# Patient Record
Sex: Female | Born: 1995
Health system: Southern US, Community
[De-identification: ages and names within clinical notes are randomized; demographics above are authoritative.]

## PROBLEM LIST (undated history)

## (undated) DIAGNOSIS — Z01419 Encounter for gynecological examination (general) (routine) without abnormal findings: Secondary | ICD-10-CM

## (undated) DIAGNOSIS — L309 Dermatitis, unspecified: Secondary | ICD-10-CM

## (undated) DIAGNOSIS — T7840XA Allergy, unspecified, initial encounter: Secondary | ICD-10-CM

## (undated) DIAGNOSIS — Z309 Encounter for contraceptive management, unspecified: Secondary | ICD-10-CM

## (undated) DIAGNOSIS — F909 Attention-deficit hyperactivity disorder, unspecified type: Secondary | ICD-10-CM

## (undated) HISTORY — DX: Allergy, unspecified, initial encounter: T78.40XA

## (undated) HISTORY — DX: Encounter for gynecological examination (general) (routine) without abnormal findings: Z01.419

## (undated) HISTORY — DX: Dermatitis, unspecified: L30.9

## (undated) HISTORY — DX: Attention-deficit hyperactivity disorder, unspecified type: F90.9

## (undated) HISTORY — DX: Encounter for contraceptive management, unspecified: Z30.9

## (undated) HISTORY — PX: GUM SURGERY: SHX658

---
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
## 2013-12-20 ENCOUNTER — Encounter: Payer: Self-pay | Admitting: Medical

## 2013-12-20 ENCOUNTER — Ambulatory Visit (INDEPENDENT_AMBULATORY_CARE_PROVIDER_SITE_OTHER): Payer: Managed Care, Other (non HMO) | Admitting: Medical

## 2013-12-20 VITALS — BP 100/60 | HR 84 | Temp 98.3°F | Resp 16 | Wt 135.0 lb

## 2013-12-20 DIAGNOSIS — J069 Acute upper respiratory infection, unspecified: Secondary | ICD-10-CM

## 2013-12-20 NOTE — Progress Notes (Signed)
Subjective:  Kelly Dean is a 18 y.o. female who presents for illness.  Symptoms include 1+wk hx/o burning nose inside, sore throat, head congestion, has had some purulent nasal discharge.  No frank sinus or teeth pain.  Has a lot of cough.  No chest congestion, but cough is productive.   [facility] and was told she had the flu, but no flu test was done, was prescribed fluticasone nasal and cough suppressant.  Using mucinex.  Was using some sudafed.  No tamiflu was prescribed.   Denies fever, NVD, rash, no body aches or chills.  Patient is a non-smoker.  +several sick contacts with colds, no flu exposures.  No other aggravating or relieving factors.  No other c/o.  ROS as in subjective    Objective: Filed Vitals:   12/20/13 1021  BP: 100/60  Pulse: 84  Temp: 98.3 F (36.8 C)  Resp: 16    General appearance: Alert, WD/WN, no distress                             Skin: warm, no rash                           Head: no sinus tenderness,                            Eyes: conjunctiva normal, corneas clear, PERRLA                            Ears: pearly TMs, external ear canals normal                          Nose: septum midline, turbinates swollen, with mild erythema and clear discharge             Mouth/throat: MMM, tongue normal, mild pharyngeal erythema                           Neck: supple, no adenopathy, no thyromegaly, nontender                          Heart: RRR, normal S1, S2, no murmurs                         Lungs: CTA bilaterally, no wheezes, rales, or rhonchi      Assessment and Plan:   Encounter Diagnosis  Name Primary?  Marland Kitchen. Upper respiratory infection Yes   Current symptoms and exam suggest URI.  This is day 8.  Advised the following:  Patient Instructions  Upper respiratory infection   Continue OTC mucinex or sudafed  Hydrate well with water  Use the flonase spray, 1-2 sprays twice daily (sniff sniff)  You can flush the sinuses with salt  water/neti pot  Salt water gargles  Make sure you are hydrating well with water  And if desired, can use Afrin at bedtime ONLY for the next 3-4 days  If no improvement or worse sinus pressure, teeth pain, fever, etc. In the next few days, then call back for antibiotic

## 2013-12-20 NOTE — Patient Instructions (Signed)
Upper respiratory infection   Continue OTC mucinex or sudafed  Hydrate well with water  Use the flonase spray, 1-2 sprays twice daily (sniff sniff)  You can flush the sinuses with salt water/neti pot  Salt water gargles  Make sure you are hydrating well with water  And if desired, can use Afrin at bedtime ONLY for the next 3-4 days  If no improvement or worse sinus pressure, teeth pain, fever, etc. In the next few days, then call back for antibiotic

## 2014-05-29 ENCOUNTER — Encounter: Payer: Self-pay | Admitting: Medical

## 2014-05-29 ENCOUNTER — Ambulatory Visit (INDEPENDENT_AMBULATORY_CARE_PROVIDER_SITE_OTHER): Payer: Managed Care, Other (non HMO) | Admitting: Medical

## 2014-05-29 VITALS — BP 98/68 | HR 62 | Temp 98.1°F | Resp 14 | Ht 60.0 in | Wt 130.0 lb

## 2014-05-29 DIAGNOSIS — L309 Dermatitis, unspecified: Secondary | ICD-10-CM

## 2014-05-29 DIAGNOSIS — Z23 Encounter for immunization: Secondary | ICD-10-CM

## 2014-05-29 DIAGNOSIS — Z8349 Family history of other endocrine, nutritional and metabolic diseases: Secondary | ICD-10-CM

## 2014-05-29 DIAGNOSIS — L259 Unspecified contact dermatitis, unspecified cause: Secondary | ICD-10-CM

## 2014-05-29 DIAGNOSIS — Z139 Encounter for screening, unspecified: Secondary | ICD-10-CM

## 2014-05-29 DIAGNOSIS — J309 Allergic rhinitis, unspecified: Secondary | ICD-10-CM

## 2014-05-29 DIAGNOSIS — Z83438 Family history of other disorder of lipoprotein metabolism and other lipidemia: Secondary | ICD-10-CM

## 2014-05-29 DIAGNOSIS — J45909 Unspecified asthma, uncomplicated: Secondary | ICD-10-CM

## 2014-05-29 DIAGNOSIS — Z7189 Other specified counseling: Secondary | ICD-10-CM

## 2014-05-29 DIAGNOSIS — Z0000 Encounter for general adult medical examination without abnormal findings: Secondary | ICD-10-CM

## 2014-05-29 DIAGNOSIS — Z7185 Encounter for immunization safety counseling: Secondary | ICD-10-CM

## 2014-05-29 LAB — LIPID PANEL
CHOL/HDL RATIO: 3.7 ratio
CHOLESTEROL: 159 mg/dL (ref 0–200)
HDL: 43 mg/dL (ref >39)
LDL CALC: 104 mg/dL — AB (ref 0–99)
Triglycerides: 61 mg/dL (ref <150)
VLDL: 12 mg/dL (ref 0–40)

## 2014-05-29 LAB — CBC WITH DIFFERENTIAL/PLATELET
BASOS PCT: 0 % (ref 0–1)
Basophils Absolute: 0 10*3/uL (ref 0.0–0.1)
EOS ABS: 0.2 10*3/uL (ref 0.0–0.7)
Eosinophils Relative: 3 % (ref 0–5)
HCT: 40.4 % (ref 36.0–46.0)
Hemoglobin: 13.6 g/dL (ref 12.0–15.0)
LYMPHS ABS: 2.4 10*3/uL (ref 0.7–4.0)
Lymphocytes Relative: 31 % (ref 12–46)
MCH: 30 pg (ref 26.0–34.0)
MCHC: 33.7 g/dL (ref 30.0–36.0)
MCV: 89.2 fL (ref 78.0–100.0)
Monocytes Absolute: 0.5 10*3/uL (ref 0.1–1.0)
Monocytes Relative: 7 % (ref 3–12)
NEUTROS ABS: 4.5 10*3/uL (ref 1.7–7.7)
NEUTROS PCT: 59 % (ref 43–77)
PLATELETS: 320 10*3/uL (ref 150–400)
RBC: 4.53 MIL/uL (ref 3.87–5.11)
RDW: 13.2 % (ref 11.5–15.5)
WBC: 7.7 10*3/uL (ref 4.0–10.5)

## 2014-05-29 NOTE — Assessment & Plan Note (Signed)
Uses albuterol inhaler sparingly, recently just once a month. She has not had any recent exacerbations and has only experienced a little bit of wheezing during exercise.

## 2014-05-29 NOTE — Addendum Note (Signed)
Addended by: Leretha DykesSCALES, Milagros Middendorf L on: 05/29/2014 12:08 PM   Modules accepted: Orders

## 2014-05-29 NOTE — Progress Notes (Signed)
Subjective:   HPI  Kelly AxonJennifer K Dean is a 18 y.o. female who presents for a complete physical. The patient reports that she is doing well and has no particular concerns today. 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    Preventative care: Last ophthalmology visit: Does not wear contacts or glasses.  Last dental visit: Sees a dentist twice a year Last colonoscopy:n/a Last mammogram:n/a Last gynecological exam:n/a Last EKG:n/a Last labs:?  Prior vaccinations: TD or Tdap:2012 Influenza:10/2013 Pneumococcal:n/a Shingles/Zostavax:n/a  Advanced directive:n/a Health care power of attorney:n/a Living will:n/a  Concerns: Asthma flare Uses albuterol inhaler sparingly, recently just once a month. She has not had any recent exacerbations and has only experienced a little bit of wheezing during exercise.  Allergies Uses Zyrtec daily and has not had any issues.  Constipation Patient reports having inconsistent bowel movements. Generally she has one bowel movement every other day. When she travels she may go as many as four days without a bowel movement. She notes some straining during bowel movements but no blood in the stool.  Reviewed their medical, surgical, family, social, medication, and allergy history and updated chart as appropriate.  Past Medical History  Diagnosis Date  . Allergy   . Asthma     EXERCISE INDUCED  . Eczema     History reviewed. No pertinent past surgical history.  History   Social History  . Marital Status: Single    Spouse Name: N/A    Number of Children: N/A  . Years of Education: N/A   Occupational History  . Not on file.   Social History Main Topics  . Smoking status: Never Smoker   . Smokeless tobacco: Never Used  . Alcohol Use: No  . Drug Use: No  . 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 Not on file    Social History Narrative   Lives with parents and little sister, cat.  [employer], exercises 3 days per week with walking, stretching.  In nursing school.    Family History  Problem Relation Age of Onset  . Hypertension Father   . Arthritis Paternal Grandmother   . Cancer Paternal Grandmother   . Asthma Paternal Grandmother   . Heart disease Paternal Grandfather   . Migraines Mother   . Cancer Paternal Aunt     breast  . Cancer Paternal Uncle     prostate  . Stroke Neg Hx   . Diabetes Neg Hx   . Heart disease Maternal Grandmother     surgery for PDA age 18, and aortic valve replacement at [age]    Current outpatient prescriptions:albuterol (PROVENTIL HFA) 108 (90 BASE) MCG/ACT inhaler, Inhale 2 puffs into the lungs every 6 (six) hours as needed., Disp: 1 Inhaler, Rfl: 5;  cetirizine (ZYRTEC) 5 MG tablet, Take 5 mg by mouth daily., Disp: , Rfl: ;  Multiple Vitamins-Minerals (HAIR/SKIN/NAILS PO), Take 1 tablet by mouth daily., Disp: , Rfl:  Multiple Vitamins-Minerals (MULTIVITAMIN WITH MINERALS) tablet, Take 1 tablet by mouth daily., Disp: , Rfl: ;  montelukast (SINGULAIR) 10 MG tablet, Take 1 tablet (10 mg total) by mouth at bedtime., Disp: 90 tablet, Rfl: 1  Allergies  Allergen Reactions  . Shellfish Allergy   . Penicillins Rash     Review of Systems Constitutional: -fever, -chills, -sweats, -unexpected weight change, -decreased appetite, -fatigue Allergy: -sneezing, -itching, -  congestion Dermatology: -changing moles, --rash, -lumps ENT: -runny nose, -ear pain, -sore throat, -hoarseness, -sinus pain, -teeth pain, - ringing in ears, -hearing loss, -nosebleeds Cardiology: -chest pain, -palpitations, -swelling, -difficulty breathing when lying flat, -waking up short of breath Respiratory: -cough, -shortness of breath, -difficulty breathing with exercise or exertion, -wheezing, -coughing up blood Gastroenterology: -abdominal pain, -nausea, -vomiting, -diarrhea, -constipation,  -blood in stool, -changes in bowel movement, -difficulty swallowing or eating Hematology: -bleeding, -bruising  Musculoskeletal: -joint aches, -muscle aches, -joint swelling, -back pain, -neck pain, -cramping, -changes in gait Ophthalmology: denies vision changes, eye redness, itching, discharge Urology: -burning with urination, -difficulty urinating, -blood in urine, -urinary frequency, -urgency, -incontinence Neurology: -headache, -weakness, -tingling, -numbness, -memory loss, -falls, -dizziness Psychology: -depressed mood, -agitation, -sleep problems     Objective:   Physical Exam  BP 98/68  Pulse 62  Temp(Src) 98.1 F (36.7 C) (Oral)  Resp 14  Ht 5' (1.524 m)  Wt 130 lb (58.968 kg)  BMI 25.39 kg/m2  LMP 04/28/2014  General appearance: alert, no distress, WD/WN, white female, pleasant Skin: no worrisome findings HEENT: normocephalic, conjunctiva/corneas normal, sclerae anicteric, PERRLA, EOMi, nares patent, no discharge or erythema, pharynx normal Oral cavity: MMM, tongue normal, teeth normal Neck: supple, no lymphadenopathy, no thyromegaly, no masses, normal ROM Chest: non tender, normal shape and expansion Heart: RRR, normal S1, S2, no murmurs Lungs: CTA bilaterally, no wheezes, rhonchi, or rales Abdomen: +bs, soft, nontender, non distended, no masses, no hepatomegaly, no splenomegaly, no bruits Back: non tender, normal ROM, no scoliosis Musculoskeletal: upper extremities non tender, no obvious deformity, normal ROM throughout, lower extremities non tender, no obvious deformity, normal ROM throughout Extremities: no edema, no cyanosis, no clubbing Pulses: 2+ symmetric, upper and lower extremities, normal cap refill Neurological: alert, oriented x 3, CN2-12 intact, strength normal upper extremities and lower extremities, sensation normal throughout, DTRs 2+ throughout, no cerebellar signs, gait normal Psychiatric: normal affect, behavior normal, pleasant  Breast/gyn -  deferred, no prior sexual activity, no c/o   Assessment and Plan :    Encounter Diagnoses  Name Primary?  . Routine general medical examination at a health care facility Yes  . Screening for condition   . Unspecified asthma(493.90)   . Eczema   . Allergic rhinitis   . Family history of hyperlipidemia      Physical exam - discussed healthy lifestyle, diet, exercise, preventative care, vaccinations, and addressed their concerns.  Handout given. Screening - hemolgobin and lipid panel today Asthma - controlled, mild intermittent Eczema - advised she not use hydrocortisone on face regularly, just for flare up, can c/t daily moisturizing lotion Allergies - advised zyrtec 10mg  daily for control Counseled on HPV vaccine, but she declines today Counseled on the Hepatitis A virus vaccine.  Vaccine information sheet given.  Hepatitis A vaccine given after consent obtained.  Patient was advised to return in 6 months for Hep A #2. Follow-up pending labs

## 2014-05-29 NOTE — Addendum Note (Signed)
Addended by: Leretha DykesSCALES, Sayana Salley L on: 05/29/2014 11:39 AM   Modules accepted: Orders

## 2014-05-31 ENCOUNTER — Telehealth: Payer: Self-pay | Admitting: Family Medicine

## 2014-05-31 NOTE — Telephone Encounter (Signed)
Pt came in requesting to have her labs re-drawn since she is now fasting.  Vincenza HewsShane was up front and re-assured her that she does not need to re-draw and she is fine.

## 2015-02-03 ENCOUNTER — Ambulatory Visit (HOSPITAL_BASED_OUTPATIENT_CLINIC_OR_DEPARTMENT_OTHER)
Admission: RE | Admit: 2015-02-03 | Discharge: 2015-02-03 | Disposition: A | Discharge status: Home / Self Care | Payer: 59 | Source: Ambulatory Visit | Attending: Emergency Medicine | Admitting: Emergency Medicine

## 2015-02-03 ENCOUNTER — Ambulatory Visit (INDEPENDENT_AMBULATORY_CARE_PROVIDER_SITE_OTHER): Payer: 59 | Admitting: Emergency Medicine

## 2015-02-03 ENCOUNTER — Other Ambulatory Visit: Payer: Self-pay | Admitting: Emergency Medicine

## 2015-02-03 VITALS — BP 110/78 | HR 77 | Temp 98.4°F | Resp 16 | Ht 60.0 in | Wt 132.2 lb

## 2015-02-03 DIAGNOSIS — R110 Nausea: Secondary | ICD-10-CM | POA: Insufficient documentation

## 2015-02-03 DIAGNOSIS — R1031 Right lower quadrant pain: Secondary | ICD-10-CM

## 2015-02-03 DIAGNOSIS — Z793 Long term (current) use of hormonal contraceptives: Secondary | ICD-10-CM | POA: Diagnosis not present

## 2015-02-03 DIAGNOSIS — N921 Excessive and frequent menstruation with irregular cycle: Secondary | ICD-10-CM

## 2015-02-03 DIAGNOSIS — R300 Dysuria: Secondary | ICD-10-CM

## 2015-02-03 LAB — POCT UA - MICROSCOPIC ONLY
BACTERIA, U MICROSCOPIC: NEGATIVE
CASTS, UR, LPF, POC: NEGATIVE
CRYSTALS, UR, HPF, POC: NEGATIVE
MUCUS UA: NEGATIVE
WBC, UR, HPF, POC: NEGATIVE
YEAST UA: NEGATIVE

## 2015-02-03 LAB — POCT URINALYSIS DIPSTICK
BILIRUBIN UA: NEGATIVE
Glucose, UA: NEGATIVE
Ketones, UA: NEGATIVE
LEUKOCYTES UA: NEGATIVE
NITRITE UA: NEGATIVE
PH UA: 7.5
Protein, UA: NEGATIVE
SPEC GRAV UA: 1.01
Urobilinogen, UA: 0.2

## 2015-02-03 LAB — POCT URINE PREGNANCY: Preg Test, Ur: NEGATIVE

## 2015-02-03 LAB — POCT CBC
Granulocyte percent: 62.9 %G (ref 37–80)
HCT, POC: 42.6 % (ref 37.7–47.9)
HEMOGLOBIN: 14 g/dL (ref 12.2–16.2)
Lymph, poc: 2.6 (ref 0.6–3.4)
MCH, POC: 30.2 pg (ref 27–31.2)
MCHC: 32.9 g/dL (ref 31.8–35.4)
MCV: 91.8 fL (ref 80–97)
MID (CBC): 0.5 (ref 0–0.9)
MPV: 8.5 fL (ref 0–99.8)
POC GRANULOCYTE: 5.3 (ref 2–6.9)
POC LYMPH PERCENT: 30.8 %L (ref 10–50)
POC MID %: 6.3 %M (ref 0–12)
Platelet Count, POC: 263 10*3/uL (ref 142–424)
RBC: 4.64 M/uL (ref 4.04–5.48)
RDW, POC: 13.1 %
WBC: 8.4 10*3/uL (ref 4.6–10.2)

## 2015-02-03 MED ORDER — HYDROCODONE-ACETAMINOPHEN 5-325 MG PO TABS: 1.0000 | ORAL_TABLET | ORAL | Status: DC | PRN

## 2015-02-03 NOTE — Progress Notes (Signed)
[facility] [address], [place] [address] [phone]  Date:  02/03/2015   Name:  Kelly Dean   DOB:  22-Jun-1996   MRN:  [id number]  PCP:  Carollee Herter, MD    Chief Complaint: RLQ Pain and Dysuria   History of Present Illness:  Kelly Dean is a 19 y.o. very pleasant female patient who presents with the following:  Awoke this morning with RLQ pain.  Has not moved Pain worse with coughing and movement.   No nausea or vomiting.  No stool change No fever or chills No dysuria, urgency or frequency. No dyspareunia Has some bloody discharge.   LMP flow last week.  Just started OCP in January and menses have been irregular. No improvement with over the counter medications or other home remedies.  Denies other complaint or health concern today.   Patient Active Problem List   Diagnosis Date Noted  . Asthma flare 01/01/2012  . Sinus arrhythmia 01/01/2012    Past Medical History  Diagnosis Date  . Allergy   . Asthma     EXERCISE INDUCED  . Eczema     History reviewed. No pertinent past surgical history.  History  Substance Use Topics  . Smoking status: Never Smoker   . Smokeless tobacco: Never Used  . Alcohol Use: No    Family History  Problem Relation Age of Onset  . Hypertension Father   . Hyperlipidemia Father   . Arthritis Paternal Grandmother   . Cancer Paternal Grandmother   . Asthma Paternal Grandmother   . Hyperlipidemia Paternal Grandmother   . Hypertension Paternal Grandmother   . Heart disease Paternal Grandfather   . Hyperlipidemia Paternal Grandfather   . Hypertension Paternal Grandfather   . Migraines Mother   . Hyperlipidemia Mother   . Cancer Paternal Aunt     breast  . Cancer Paternal Uncle     prostate  . Stroke Neg Hx   . Diabetes Neg Hx   . Heart disease Maternal Grandmother     surgery for PDA age 72, and aortic valve replacement at 78  . Cancer Maternal Grandmother   . Hearing loss Maternal  Grandmother     Allergies  Allergen Reactions  . Shellfish Allergy   . Penicillins Rash    Medication list has been reviewed and updated.  Current Outpatient Prescriptions on File Prior to Visit  Medication Sig Dispense Refill  . albuterol (PROVENTIL HFA) 108 (90 BASE) MCG/ACT inhaler Inhale 2 puffs into the lungs every 6 (six) hours as needed. 1 Inhaler 5  . Multiple Vitamins-Minerals (HAIR/SKIN/NAILS PO) Take 1 tablet by mouth daily.    . Multiple Vitamins-Minerals (MULTIVITAMIN WITH MINERALS) tablet Take 1 tablet by mouth daily.     No current facility-administered medications on file prior to visit.    Review of Systems:  As per HPI, otherwise negative.    Physical Examination: Filed Vitals:   02/03/15 1354  BP: 110/78  Pulse: 77  Temp: 98.4 F (36.9 C)  Resp: 16   Filed Vitals:   02/03/15 1354  Height: 5' (1.524 m)  Weight: 132 lb 4 oz (59.988 kg)   Body mass index is 25.83 kg/(m^2). Ideal Body Weight: Weight in (lb) to have BMI = 25: 127.7  GEN: WDWN, NAD, Non-toxic, A & O x 3 HEENT: Atraumatic, Normocephalic. Neck supple. No masses, No LAD. Ears and Nose: No external deformity. CV: RRR, No M/G/R. No JVD. No thrill. No extra heart  sounds. PULM: CTA B, no wheezes, crackles, rhonchi. No retractions. No resp. distress. No accessory muscle use. ABD: S, tender RLQ , ND, +BS. No rebound. No HSM. EXTR: No c/c/e NEURO Normal gait.  PSYCH: Normally interactive. Conversant. Not depressed or anxious appearing.  Calm demeanor.    Assessment and Plan: Pelvic pain  sono   Signed,  Phillips OdorJeffery Xochilth Standish, MD   Results for orders placed or performed in visit on 02/03/15  POCT urinalysis dipstick  Result Value Ref Range   Color, UA yellow    Clarity, UA clear    Glucose, UA neg    Bilirubin, UA neg    Ketones, UA neg    Spec Grav, UA 1.010    Blood, UA small    pH, UA 7.5    Protein, UA neg    Urobilinogen, UA 0.2    Nitrite, UA neg    Leukocytes, UA Negative    POCT UA - Microscopic Only  Result Value Ref Range   WBC, Ur, HPF, POC neg    RBC, urine, microscopic 2-6    Bacteria, U Microscopic neg    Mucus, UA neg    Epithelial cells, urine per micros 0-2    Crystals, Ur, HPF, POC neg    Casts, Ur, LPF, POC neg    Yeast, UA neg   POCT urine pregnancy  Result Value Ref Range   Preg Test, Ur Negative    Results for orders placed or performed in visit on 02/03/15  POCT urinalysis dipstick  Result Value Ref Range   Color, UA yellow    Clarity, UA clear    Glucose, UA neg    Bilirubin, UA neg    Ketones, UA neg    Spec Grav, UA 1.010    Blood, UA small    pH, UA 7.5    Protein, UA neg    Urobilinogen, UA 0.2    Nitrite, UA neg    Leukocytes, UA Negative   POCT UA - Microscopic Only  Result Value Ref Range   WBC, Ur, HPF, POC neg    RBC, urine, microscopic 2-6    Bacteria, U Microscopic neg    Mucus, UA neg    Epithelial cells, urine per micros 0-2    Crystals, Ur, HPF, POC neg    Casts, Ur, LPF, POC neg    Yeast, UA neg   POCT urine pregnancy  Result Value Ref Range   Preg Test, Ur Negative   POCT CBC  Result Value Ref Range   WBC 8.4 4.6 - 10.2 K/uL   Lymph, poc 2.6 0.6 - 3.4   POC LYMPH PERCENT 30.8 10 - 50 %L   MID (cbc) 0.5 0 - 0.9   POC MID % 6.3 0 - 12 %M   POC Granulocyte 5.3 2 - 6.9   Granulocyte percent 62.9 37 - 80 %G   RBC 4.64 4.04 - 5.48 M/uL   Hemoglobin 14.0 12.2 - 16.2 g/dL   HCT, POC 16.142.6 09.637.7 - 47.9 %   MCV 91.8 80 - 97 fL   MCH, POC 30.2 27 - 31.2 pg   MCHC 32.9 31.8 - 35.4 g/dL   RDW, POC 04.513.1 %   Platelet Count, POC 263 142 - 424 K/uL   MPV 8.5 0 - 99.8 fL

## 2015-02-03 NOTE — Patient Instructions (Signed)
Go to [facility] register at emergency department for outpatient ultrasound. DO NOT register as ED patient.

## 2015-02-04 ENCOUNTER — Ambulatory Visit (INDEPENDENT_AMBULATORY_CARE_PROVIDER_SITE_OTHER): Payer: 59 | Admitting: Emergency Medicine

## 2015-02-04 VITALS — BP 110/60 | HR 76 | Temp 98.2°F | Resp 16 | Ht 60.0 in | Wt 131.4 lb

## 2015-02-04 DIAGNOSIS — R102 Pelvic and perineal pain: Secondary | ICD-10-CM

## 2015-02-04 NOTE — Patient Instructions (Signed)
Pelvic Pain Female pelvic pain can be caused by many different things and start from a variety of places. Pelvic pain refers to pain that is located in the lower half of the abdomen and between your hips. The pain may occur over a short period of time (acute) or may be reoccurring (chronic). The cause of pelvic pain may be related to disorders affecting the female reproductive organs (gynecologic), but it may also be related to the bladder, kidney stones, an intestinal complication, or muscle or skeletal problems. Getting help right away for pelvic pain is important, especially if there has been severe, sharp, or a sudden onset of unusual pain. It is also important to get help right away because some types of pelvic pain can be life threatening.  CAUSES  Below are only some of the causes of pelvic pain. The causes of pelvic pain can be in one of several categories.   Gynecologic.  Pelvic inflammatory disease.  Sexually transmitted infection.  Ovarian cyst or a twisted ovarian ligament (ovarian torsion).  Uterine lining that grows outside the uterus (endometriosis).  Fibroids, cysts, or tumors.  Ovulation.  Pregnancy.  Pregnancy that occurs outside the uterus (ectopic pregnancy).  Miscarriage.  Labor.  Abruption of the placenta or ruptured uterus.  Infection.  Uterine infection (endometritis).  Bladder infection.  Diverticulitis.  Miscarriage related to a uterine infection (septic abortion).  Bladder.  Inflammation of the bladder (cystitis).  Kidney stone(s).  Gastrointestinal.  Constipation.  Diverticulitis.  Neurologic.  Trauma.  Feeling pelvic pain because of mental or emotional causes (psychosomatic).  Cancers of the bowel or pelvis. EVALUATION  Your caregiver will want to take a careful history of your concerns. This includes recent changes in your health, a careful gynecologic history of your periods (menses), and a sexual history. Obtaining your family  history and medical history is also important. Your caregiver may suggest a pelvic exam. A pelvic exam will help identify the location and severity of the pain. It also helps in the evaluation of which organ system may be involved. In order to identify the cause of the pelvic pain and be properly treated, your caregiver may order tests. These tests may include:   A pregnancy test.  Pelvic ultrasonography.  An X-ray exam of the abdomen.  A urinalysis or evaluation of vaginal discharge.  Blood tests. HOME CARE INSTRUCTIONS   Only take over-the-counter or prescription medicines for pain, discomfort, or fever as directed by your caregiver.   Rest as directed by your caregiver.   Eat a balanced diet.   Drink enough fluids to make your urine clear or pale yellow, or as directed.   Avoid sexual intercourse if it causes pain.   Apply warm or cold compresses to the lower abdomen depending on which one helps the pain.   Avoid stressful situations.   Keep a journal of your pelvic pain. Write down when it started, where the pain is located, and if there are things that seem to be associated with the pain, such as food or your menstrual cycle.  Follow up with your caregiver as directed.  SEEK MEDICAL CARE IF:  Your medicine does not help your pain.  You have abnormal vaginal discharge. SEEK IMMEDIATE MEDICAL CARE IF:   You have heavy bleeding from the vagina.   Your pelvic pain increases.   You feel light-headed or faint.   You have chills.   You have pain with urination or blood in your urine.   You have uncontrolled diarrhea   or vomiting.   You have a fever or persistent symptoms for more than 3 days.  You have a fever and your symptoms suddenly get worse.   You are being physically or sexually abused.  MAKE SURE YOU:  Understand these instructions.  Will watch your condition.  Will get help if you are not doing well or get worse. Document Released:  10/28/2004 Document Revised: 04/17/2014 Document Reviewed: 03/22/2012 ExitCare Patient Information 2015 [employer]. This information is not intended to replace advice given to you by your health care provider. Make sure you discuss any questions you have with your health care provider.  

## 2015-02-04 NOTE — Progress Notes (Signed)
[facility] [address] [place] [address] [phone]  Date:  02/04/2015   Name:  Kelly Dean   DOB:  June 23, 1996   MRN:  [id number]  PCP:  Carollee Herter, MD    Chief Complaint: Follow-up   History of Present Illness:  Kelly Dean is a 19 y.o. very pleasant female patient who presents with the following:  Seen yesterday with acute RLQ pain and cramping. Now to office for recheck.  Says pain is now in suprapubic region and much improved.  Some dysuria and urgency. No discharge.  Has resumed bleeding.   No stool change or nausea or vomiting No fever or chills No improvement with over the counter medications or other home remedies.  Denies other complaint or health concern today.   Patient Active Problem List   Diagnosis Date Noted  . Asthma flare 01/01/2012  . Sinus arrhythmia 01/01/2012    Past Medical History  Diagnosis Date  . Allergy   . Asthma     EXERCISE INDUCED  . Eczema     History reviewed. No pertinent past surgical history.  History  Substance Use Topics  . Smoking status: Never Smoker   . Smokeless tobacco: Never Used  . Alcohol Use: No    Family History  Problem Relation Age of Onset  . Hypertension Father   . Hyperlipidemia Father   . Arthritis Paternal Grandmother   . Cancer Paternal Grandmother   . Asthma Paternal Grandmother   . Hyperlipidemia Paternal Grandmother   . Hypertension Paternal Grandmother   . Heart disease Paternal Grandfather   . Hyperlipidemia Paternal Grandfather   . Hypertension Paternal Grandfather   . Migraines Mother   . Hyperlipidemia Mother   . Cancer Paternal Aunt     breast  . Cancer Paternal Uncle     prostate  . Stroke Neg Hx   . Diabetes Neg Hx   . Heart disease Maternal Grandmother     surgery for PDA age 65, and aortic valve replacement at 9  . Cancer Maternal Grandmother   . Hearing loss Maternal Grandmother     Allergies  Allergen Reactions  . Shellfish  Allergy   . Penicillins Rash    Medication list has been reviewed and updated.  Current Outpatient Prescriptions on File Prior to Visit  Medication Sig Dispense Refill  . albuterol (PROVENTIL HFA) 108 (90 BASE) MCG/ACT inhaler Inhale 2 puffs into the lungs every 6 (six) hours as needed. 1 Inhaler 5  . HYDROcodone-acetaminophen (NORCO) 5-325 MG per tablet Take 1-2 tablets by mouth every 4 (four) hours as needed. 30 tablet 0  . Multiple Vitamins-Minerals (HAIR/SKIN/NAILS PO) Take 1 tablet by mouth daily.    . Multiple Vitamins-Minerals (MULTIVITAMIN WITH MINERALS) tablet Take 1 tablet by mouth daily.    . Norethindrone Acetate-Ethinyl Estradiol (JUNEL,LOESTRIN,MICROGESTIN) 1.5-30 MG-MCG tablet Take 1 tablet by mouth daily.     No current facility-administered medications on file prior to visit.    Review of Systems:  As per HPI, otherwise negative.    Physical Examination: Filed Vitals:   02/04/15 1504  BP: 110/60  Pulse: 76  Temp: 98.2 F (36.8 C)  Resp: 16   Filed Vitals:   02/04/15 1504  Height: 5' (1.524 m)  Weight: 131 lb 6 oz (59.591 kg)   Body mass index is 25.66 kg/(m^2). Ideal Body Weight: Weight in (lb) to have BMI = 25: 127.7   GEN: WDWN, NAD, Non-toxic, Alert & Oriented x  3 HEENT: Atraumatic, Normocephalic.  Ears and Nose: No external deformity. EXTR: No clubbing/cyanosis/edema NEURO: Normal gait.  PSYCH: Normally interactive. Conversant. Not depressed or anxious appearing.  Calm demeanor.  ABD:  Soft not tender  Assessment and Plan: Pelvic pain GYN tomorrow  Signed,  Phillips OdorJeffery Kerianna Rawlinson, MD

## 2015-03-29 ENCOUNTER — Ambulatory Visit (INDEPENDENT_AMBULATORY_CARE_PROVIDER_SITE_OTHER): Payer: 59 | Admitting: Family Medicine

## 2015-03-29 ENCOUNTER — Encounter: Payer: Self-pay | Admitting: Family Medicine

## 2015-03-29 VITALS — BP 118/70 | HR 84 | Temp 100.9°F | Ht 60.0 in | Wt 132.4 lb

## 2015-03-29 DIAGNOSIS — I889 Nonspecific lymphadenitis, unspecified: Secondary | ICD-10-CM

## 2015-03-29 DIAGNOSIS — R509 Fever, unspecified: Secondary | ICD-10-CM | POA: Diagnosis not present

## 2015-03-29 DIAGNOSIS — R6883 Chills (without fever): Secondary | ICD-10-CM | POA: Diagnosis not present

## 2015-03-29 LAB — CBC WITH DIFFERENTIAL/PLATELET
HEMATOCRIT: 41.3 % (ref 36.0–46.0)
HEMOGLOBIN: 14 g/dL (ref 12.0–15.0)
Lymphocytes Relative: 14 % (ref 12–46)
Lymphs Abs: 1.7 10*3/uL (ref 0.7–4.0)
MCH: 30.4 pg (ref 26.0–34.0)
MCHC: 33.9 g/dL (ref 30.0–36.0)
MCV: 89.8 fL (ref 78.0–100.0)
MONO ABS: 0.6 10*3/uL (ref 0.1–1.0)
MONOS PCT: 5 % (ref 3–12)
Neutro Abs: 10 10*3/uL — ABNORMAL HIGH (ref 1.7–7.7)
Neutrophils Relative %: 81 % — ABNORMAL HIGH (ref 43–77)
Platelets: 253 10*3/uL (ref 150–400)
RBC: 4.6 MIL/uL (ref 3.87–5.11)
RDW: 12.3 % (ref 11.5–15.5)
WBC: 12.4 10*3/uL — ABNORMAL HIGH (ref 4.0–10.5)

## 2015-03-29 LAB — POCT MONO (EPSTEIN BARR VIRUS): MONO, POC: NEGATIVE

## 2015-03-29 LAB — POC INFLUENZA A&B (BINAX/QUICKVUE)
INFLUENZA A, POC: NEGATIVE
INFLUENZA B, POC: NEGATIVE

## 2015-03-29 MED ORDER — CEPHALEXIN 500 MG PO CAPS: 500.0000 mg | ORAL_CAPSULE | Freq: Four times a day (QID) | ORAL | Status: DC

## 2015-03-29 NOTE — Progress Notes (Signed)
Chief Complaint  Patient presents with  . Cough    started yesterday with fever, chills, body aches and cough. High temp of 102. Did have flu shot.    Yesterday morning she started with pain in her lymph nodes on the left side of her neck, hurts to touch. She denies any sore throat or pain with swallowing. Later in the day she started feeling chilled, tired.  After an hour nap she had a fever of 101, and later it went up to 9pm.  This morning she started with a cough, nonproductive.  Denies chest tightness or wheezing.  She has slight sniffles, no discolored mucus.  No sinus pain or pressure. No ear pain.  No known sick contacts.  Past Medical History  Diagnosis Date  . Allergy   . Asthma     EXERCISE INDUCED  . Eczema    History reviewed. No pertinent past surgical history. History   Social History  . Marital Status: Single    Spouse Name: N/A  . Number of Children: N/A  . Years of Education: N/A   Occupational History  . Not on file.   Social History Main Topics  . Smoking status: Never Smoker   . Smokeless tobacco: Never Used  . Alcohol Use: No  . Drug Use: No  . Sexual Activity: No     Comment: senior, wants to go to [employer], wants to do ultrasound tech   Other Topics Concern  . Not on file   Social History Narrative   Lives with parents and little sister, cat.  Christian, exercises 3 days per week with walking, stretching.  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     Outpatient Encounter Prescriptions as of 03/29/2015  Medication Sig Note  . acetaminophen (TYLENOL) 325 MG tablet Take 650 mg by mouth every 6 (six) hours as needed. 03/29/2015: Last dose 5am  . Multiple Vitamins-Minerals (MULTIVITAMIN WITH MINERALS) tablet Take 1 tablet by mouth daily.   Marland Kitchen PORTIA-28 0.15-30 MG-MCG tablet Take 1 tablet by mouth daily. 03/29/2015: Received from: External Pharmacy  . albuterol (PROVENTIL HFA) 108 (90 BASE) MCG/ACT inhaler  Inhale 2 puffs into the lungs every 6 (six) hours as needed. (Patient not taking: Reported on 03/29/2015)   . [DISCONTINUED] HYDROcodone-acetaminophen (NORCO) 5-325 MG per tablet Take 1-2 tablets by mouth every 4 (four) hours as needed.   . [DISCONTINUED] Multiple Vitamins-Minerals (HAIR/SKIN/NAILS PO) Take 1 tablet by mouth daily.   . [DISCONTINUED] Norethindrone Acetate-Ethinyl Estradiol (JUNEL,LOESTRIN,MICROGESTIN) 1.5-30 MG-MCG tablet Take 1 tablet by mouth daily.    ROS:  No nausea, vomiting, diarrhea, bleeding, bruising, rashes. Headache only with fever, otherwise headaches, dizziness. No urinary symptoms. No chest pain, palpitations or other concerns except as noted in HPI.  PHYSICAL EXAM: BP 118/70 mmHg  Pulse 84  Temp(Src) 100.9 F (38.3 C) (Tympanic)  Ht 5' (1.524 m)  Wt 132 lb 6.4 oz (60.056 kg)  BMI 25.86 kg/m2  LMP 03/06/2015 Well appearing female, appears to be in mild discomfort related to her left neck. HEENT: Nasal mucosa is mildly edematous, more on the left, with some clear-white drainage on the left.  TM's and EAC's normal.  Conjunctiva and sclera are clear, PERRL, EOMI. OP is clear without erythema or exudate, no mucosal abnormalities Neck: very tender, enlarged left submandibular lymph node. No overlying erythema/warmth. Remainder of lymph nodes are normal, nontender Heart: regular rate and rhythm, no murmur Lungs: clear bilaterally Extremities: no edema Skin:  no rashes Abdomen: soft, nontender  Flu test negative Mono spot test negative  ASSESSMENT/PLAN:  Chills - Plan: POC Influenza A&B (Binax test)  Fever, unspecified fever cause - Plan: POC Influenza A&B (Binax test), Mono (Epstein Barr Virus)  Lymphadenitis - Plan: CBC with Differential/Platelet, Mono (Epstein Barr Virus), cephALEXin (KEFLEX) 500 MG capsule   Monospot and CBC Await results to determine need for ABX PCN allergy Treat for lymphadenitis--keflex. Discussed risk of potential allergic  reaction  Drink plenty of fluids. Use tylenol and/or ibuprofen OR aleve as needed for pain and/or fever If cough worsens, consider using mucinex DM or robitussin DM. You have some congestion in the nose--if worsening, you can add a decongestant like sudafed.  We will be in touch with CBC results to let you know if we need to start antibiotics (won't if it definitely appears viral).  Return if ongoing fevers, worsening symptoms of any kind for re-evaluation.   Addendum: Lab Results  Component Value Date   WBC 12.4* 03/29/2015   HGB 14.0 03/29/2015   HCT 41.3 03/29/2015   MCV 89.8 03/29/2015   PLT 253 03/29/2015   rx for Keflex sent in; pt aware, and aware of potential cross reactivity re: allergies

## 2015-03-29 NOTE — Patient Instructions (Signed)
  Drink plenty of fluids. Use tylenol and/or ibuprofen OR aleve as needed for pain and/or fever If cough worsens, consider using mucinex DM or robitussin DM. You have some congestion in the nose--if worsening, you can add a decongestant like sudafed.  We will be in touch with CBC results to let you know if we need to start antibiotics (won't if it definitely appears viral).  Return if ongoing fevers, worsening symptoms of any kind for re-evaluation.

## 2015-04-17 ENCOUNTER — Encounter: Payer: Self-pay | Admitting: Family Medicine

## 2015-04-17 ENCOUNTER — Ambulatory Visit (INDEPENDENT_AMBULATORY_CARE_PROVIDER_SITE_OTHER): Payer: 59 | Admitting: Family Medicine

## 2015-04-17 VITALS — BP 94/60 | HR 133 | Temp 102.5°F | Wt 130.8 lb

## 2015-04-17 DIAGNOSIS — J039 Acute tonsillitis, unspecified: Secondary | ICD-10-CM

## 2015-04-17 LAB — POCT RAPID STREP A (OFFICE): Rapid Strep A Screen: NEGATIVE

## 2015-04-17 MED ORDER — AZITHROMYCIN 500 MG PO TABS: 500.0000 mg | ORAL_TABLET | Freq: Every day | ORAL | Status: DC

## 2015-04-17 NOTE — Progress Notes (Signed)
   Subjective:    Patient ID: Kelly Dean, female    DOB: 1996-10-09, 19 y.o.   MRN: [id number]  HPI She is here for evaluation of a sore throat started 5 days ago. She was seen yesterday and urgent care center and apparently throat swab was negative. She developed fever today as well as malaise and fatigue. No coughing. She also is concerned over possible STD exposure from an encounter 3 weeks ago.there apparently was no exchange of oral body fluid.   Review of Systems     Objective:   Physical Exam Alert and in no distress. Tympanic membranes and canals are normal. Pharyngeal area is red with tonsils being swollen with exudates.. Neck is supple with shotty adenopathy or thyromegaly. Cardiac exam shows a regular sinus rhythm without murmurs or gallops. Lungs are clear to auscultation. Strep screen is negative       Assessment & Plan:  Acute tonsillitis - Plan: azithromycin (ZITHROMAX) 500 MG tablet, Rapid Strep A clinically she has 3 of the 4 criteria for strep and I will therefore treat her especially since there is a possibility of STD exposure. I think azithromycin is a good choice.

## 2015-06-05 ENCOUNTER — Ambulatory Visit (INDEPENDENT_AMBULATORY_CARE_PROVIDER_SITE_OTHER): Payer: 59 | Admitting: Medical

## 2015-06-05 ENCOUNTER — Encounter: Payer: Self-pay | Admitting: Medical

## 2015-06-05 VITALS — BP 100/70 | HR 105 | Temp 98.2°F | Resp 16 | Ht 60.0 in | Wt 128.0 lb

## 2015-06-05 DIAGNOSIS — Z7189 Other specified counseling: Secondary | ICD-10-CM

## 2015-06-05 DIAGNOSIS — J4520 Mild intermittent asthma, uncomplicated: Secondary | ICD-10-CM | POA: Insufficient documentation

## 2015-06-05 DIAGNOSIS — Z23 Encounter for immunization: Secondary | ICD-10-CM

## 2015-06-05 DIAGNOSIS — Z7185 Encounter for immunization safety counseling: Secondary | ICD-10-CM

## 2015-06-05 DIAGNOSIS — Z0000 Encounter for general adult medical examination without abnormal findings: Secondary | ICD-10-CM

## 2015-06-05 LAB — CBC
HEMATOCRIT: 41.4 % (ref 36.0–46.0)
Hemoglobin: 13.7 g/dL (ref 12.0–15.0)
MCH: 30 pg (ref 26.0–34.0)
MCHC: 33.1 g/dL (ref 30.0–36.0)
MCV: 90.6 fL (ref 78.0–100.0)
MPV: 10.8 fL (ref 8.6–12.4)
Platelets: 364 10*3/uL (ref 150–400)
RBC: 4.57 MIL/uL (ref 3.87–5.11)
RDW: 13.2 % (ref 11.5–15.5)
WBC: 8.7 10*3/uL (ref 4.0–10.5)

## 2015-06-05 LAB — POCT URINALYSIS DIPSTICK
Bilirubin, UA: NEGATIVE
Blood, UA: NEGATIVE
Glucose, UA: NEGATIVE
Ketones, UA: NEGATIVE
Leukocytes, UA: NEGATIVE
Nitrite, UA: NEGATIVE
Protein, UA: NEGATIVE
Spec Grav, UA: 1.02
Urobilinogen, UA: NEGATIVE
pH, UA: 6.5

## 2015-06-05 MED ORDER — ALBUTEROL SULFATE HFA 108 (90 BASE) MCG/ACT IN AERS: 2.0000 | INHALATION_SPRAY | Freq: Four times a day (QID) | RESPIRATORY_TRACT | Status: DC | PRN

## 2015-06-05 NOTE — Progress Notes (Signed)
Subjective:   HPI  Kelly Dean is a 19 y.o. female who presents for a complete physical.  Medical care team includes:  Dr. Renaldo Fiddler, [facility]   Kristian Covey, PA-C here for primary care dentist   Preventative care: Last ophthalmology visit: N/A Last dental visit: YES  Last gynecological exam: 06/05/15 Last labs: last year  Prior vaccinations: TD or Tdap:7/12 Influenza: yearly  Recently established with gynecology, had pap today, on OCPs, recent STD testing in 01/2015, no prior pregnancy.  Just became sexually active recently, using condoms always.  Reviewed their medical, surgical, family, social, medication, and allergy history and updated chart as appropriate.  Past Medical History  Diagnosis Date  . Allergy   . Eczema   . Asthma 05/2015    exercise induced, mild intermittent  . Contraception management     [facility], started [facility] 2016  . Encounter for routine gynecological examination     sees Dr. Renaldo Fiddler, [facility]    Past Surgical History  Procedure Laterality Date  . No past surgeries  05/2015    History   Social History  . Marital Status: Single    Spouse Name: N/A  . Number of Children: N/A  . Years of Education: N/A   Occupational History  . Not on file.   Social History Main Topics  . Smoking status: Never Smoker   . Smokeless tobacco: Never Used  . Alcohol Use: 0.6 oz/week    1 Cans of beer per week  . Drug Use: No  . 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 Not on file   Social History Narrative   Lives with parents and little sister, cat.  Christian, exercises 3 days per week with walking, stretching.  In nursing school, Chief Technology Officer.  .      Family History  Problem Relation Age of Onset  . Hypertension Father   . Hyperlipidemia Father   . Arthritis Paternal Grandmother   . Cancer Paternal Grandmother   . Asthma  Paternal Grandmother   . Hyperlipidemia Paternal Grandmother   . Hypertension Paternal Grandmother   . Heart disease Paternal Grandfather   . Hyperlipidemia Paternal Grandfather   . Hypertension Paternal Grandfather   . Migraines Mother   . Hyperlipidemia Mother   . Cancer Paternal Aunt     breast  . Cancer Paternal Uncle     prostate  . Stroke Neg Hx   . Diabetes Neg Hx   . Heart disease Maternal Grandmother     surgery for PDA age 12, and aortic valve replacement at 24  . Cancer Maternal Grandmother   . Hearing loss Maternal Grandmother      Current outpatient prescriptions:  .  albuterol (PROVENTIL HFA) 108 (90 BASE) MCG/ACT inhaler, Inhale 2 puffs into the lungs every 6 (six) hours as needed., Disp: 1 Inhaler, Rfl: 0 .  Multiple Vitamins-Minerals (MULTIVITAMIN WITH MINERALS) tablet, Take 1 tablet by mouth daily., Disp: , Rfl:  .  PORTIA-28 0.15-30 MG-MCG tablet, Take 1 tablet by mouth daily., Disp: , Rfl: 4 .  acetaminophen (TYLENOL) 325 MG tablet, Take 650 mg by mouth every 6 (six) hours as needed., Disp: , Rfl:   Allergies  Allergen Reactions  . Penicillins Rash and Hives  . Shellfish Allergy     Review of Systems Constitutional: -fever, -chills, -sweats, -unexpected weight change, -decreased appetite, -  fatigue Allergy: -sneezing, -itching, -congestion Dermatology: -changing moles, --rash, -lumps ENT: -runny nose, -ear pain, -sore throat, -hoarseness, -sinus pain, -teeth pain, - ringing in ears, -hearing loss, -nosebleeds Cardiology: -chest pain, -palpitations, -swelling, -difficulty breathing when lying flat, -waking up short of breath Respiratory: -cough, -shortness of breath, -difficulty breathing with exercise or exertion, -wheezing, -coughing up blood Gastroenterology: -abdominal pain, -nausea, -vomiting, -diarrhea, -constipation, -blood in stool, -changes in bowel movement, -difficulty swallowing or eating Hematology: -bleeding, -bruising  Musculoskeletal: -joint  aches, -muscle aches, -joint swelling, -back pain, -neck pain, -cramping, -changes in gait Ophthalmology: denies vision changes, eye redness, itching, discharge Urology: -burning with urination, -difficulty urinating, -blood in urine, -urinary frequency, -urgency, -incontinence Neurology: -headache, -weakness, -tingling, -numbness, -memory loss, -falls, -dizziness Psychology: -depressed mood, -agitation, -sleep problems     Objective:   Physical Exam  BP 100/70 mmHg  Pulse 105  Temp(Src) 98.2 F (36.8 C) (Oral)  Resp 16  Ht 5' (1.524 m)  Wt 128 lb (58.06 kg)  BMI 25.00 kg/m2  General appearance: alert, no distress, WD/WN, white female, pleasant Skin: no worrisome findings HEENT: normocephalic, conjunctiva/corneas normal, sclerae anicteric, PERRLA, EOMi, nares patent, no discharge or erythema, pharynx normal Oral cavity: MMM, tongue normal, teeth normal Neck: supple, no lymphadenopathy, no thyromegaly, no masses, normal ROM Chest: non tender, normal shape and expansion Heart: RRR, normal S1, S2, no murmurs Lungs: CTA bilaterally, no wheezes, rhonchi, or rales Abdomen: +bs, soft, nontender, non distended, no masses, no hepatomegaly, no splenomegaly, no bruits Back: non tender, normal ROM, no scoliosis Musculoskeletal: upper extremities non tender, no obvious deformity, normal ROM throughout, lower extremities non tender, no obvious deformity, normal ROM throughout Extremities: no edema, no cyanosis, no clubbing Pulses: 2+ symmetric, upper and lower extremities, normal cap refill Neurological: alert, oriented x 3, CN2-12 intact, strength normal upper extremities and lower extremities, sensation normal throughout, DTRs 2+ throughout, no cerebellar signs, gait normal Psychiatric: normal affect, behavior normal, pleasant  Breast/gyn - deferred, no c/o    Assessment and Plan :    Encounter Diagnoses  Name Primary?  . Adult general medical examination Yes  . Need for prophylactic  vaccination and inoculation against viral hepatitis   . Vaccine counseling   . Asthma, mild intermittent, uncomplicated     Physical exam - discussed healthy lifestyle, diet, exercise, preventative care, vaccinations, and addressed their concerns.  Handout given. See your gynecologist yearly for routine gynecological care. See your dentist yearly for routine dental care including hygiene visits twice yearly. Discussed labs from last year, recent lymphadenitis.  Repeat CBC today at her request Counseled on the Hepatitis A virus vaccine.  Vaccine information sheet given.  Hepatitis A vaccine given after consent obtained.   Counseled on yealry flu vaccine Advised HPV vaccine.  She declines today but will consider.   Asthma - mild intermittent, no recent problems.  Albuterol prn. Follow-up pending labs

## 2015-06-20 IMAGING — US US ART/VEN ABD/PELV/SCROTUM DOPPLER LTD
1 series · 13 of 25 positions shown · non-contrast
Comparison: None.

CLINICAL DATA: Initial evaluation for right lower quadrant pain
since this morning, getting worse, long menstrual periods, on birth
control pills, mild nausea with pain localized to the right adnexal
region

EXAM:
TRANSABDOMINAL AND TRANSVAGINAL ULTRASOUND OF PELVIS
DOPPLER ULTRASOUND OF OVARIES
TECHNIQUE: Both transabdominal and transvaginal ultrasound examinations of the
pelvis were performed. Transabdominal technique was performed for
global imaging of the pelvis including uterus, ovaries, adnexal
regions, and pelvic cul-de-sac.
It was necessary to proceed with endovaginal exam following the
transabdominal exam to visualize the endometrium and ovaries. Color
and duplex Doppler ultrasound was utilized to evaluate blood flow to
the ovaries.

[Series 1: us art/ven abd/pelv/scrotum doppler ltd · 0.18mm/px · 13 of 93 slices shown]
[im 1/93]
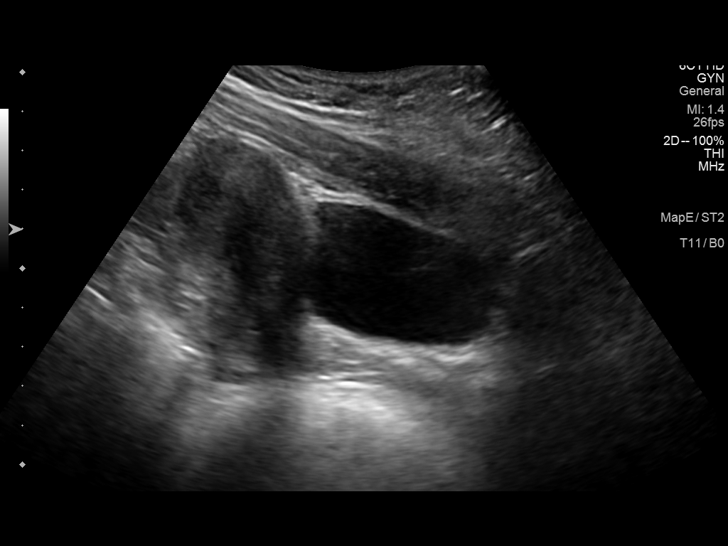
[im 8/93]
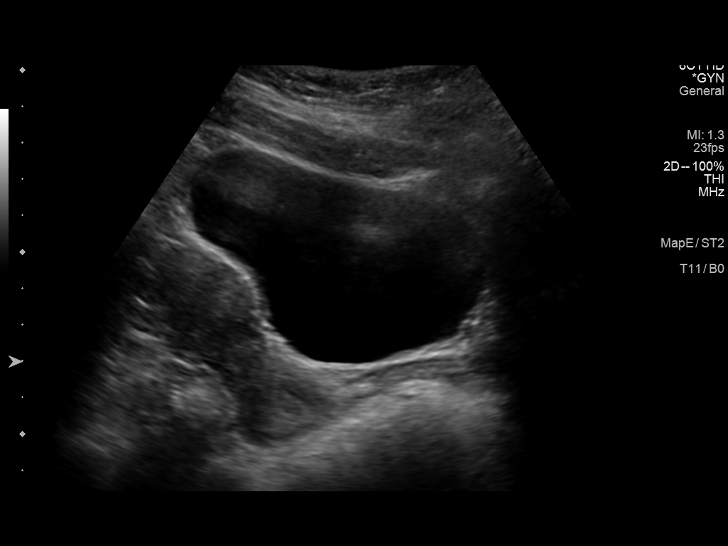
[im 16/93]
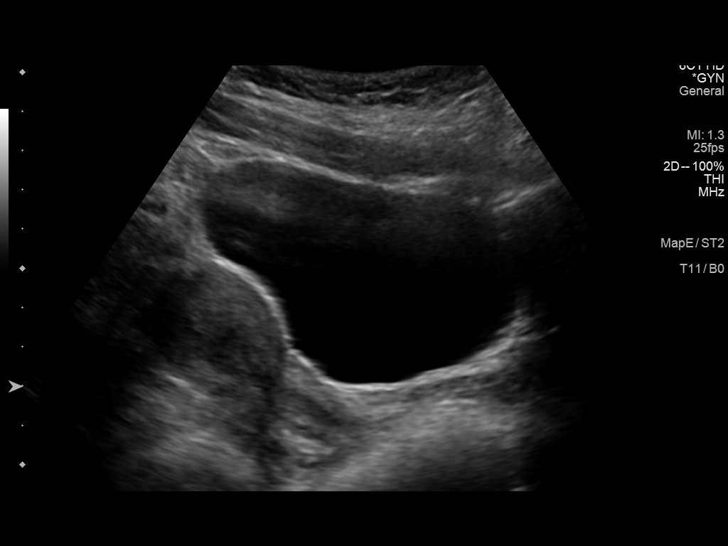
[im 24/93]
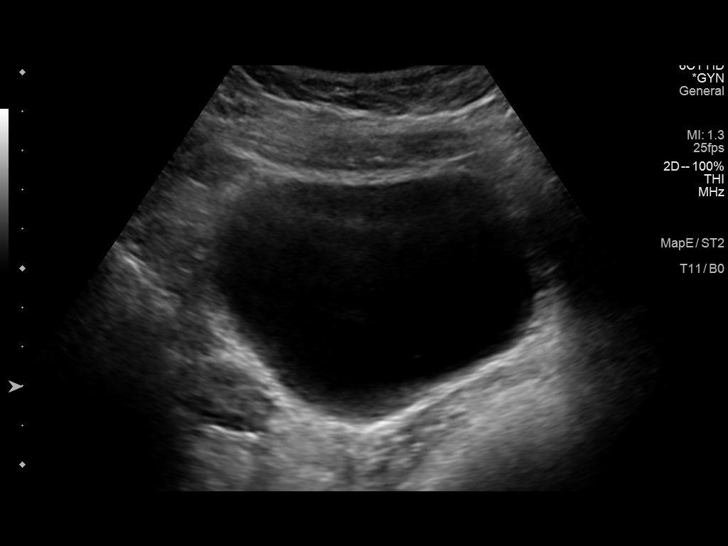
[im 31/93]
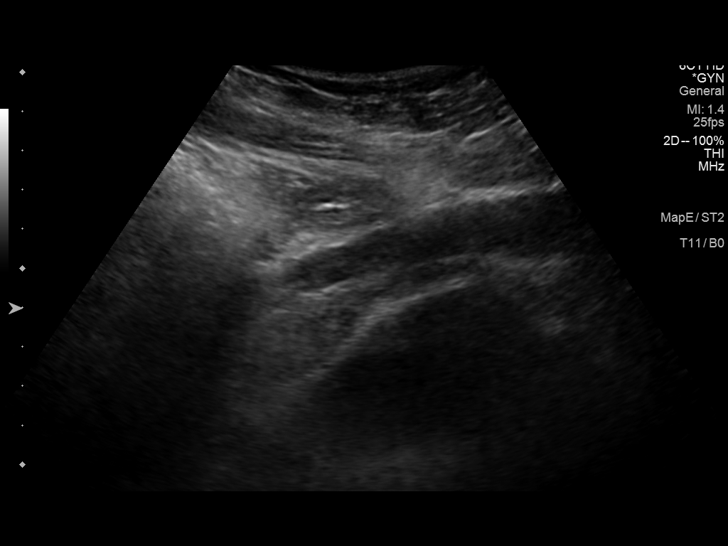
[im 39/93]
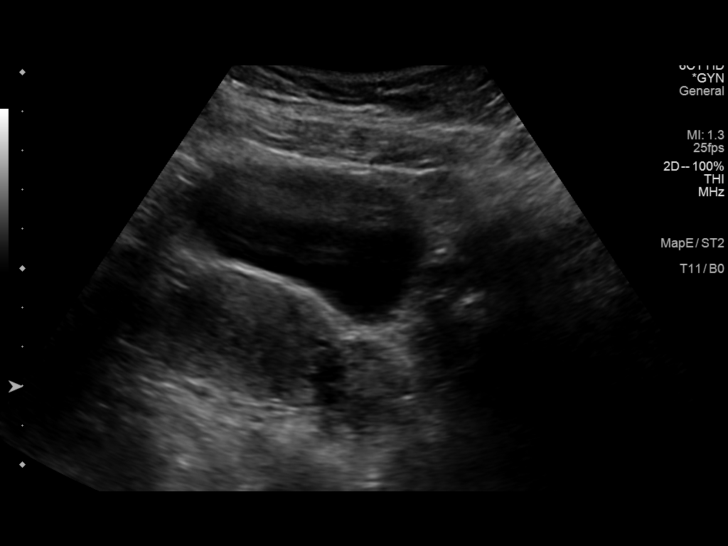
[im 47/93]
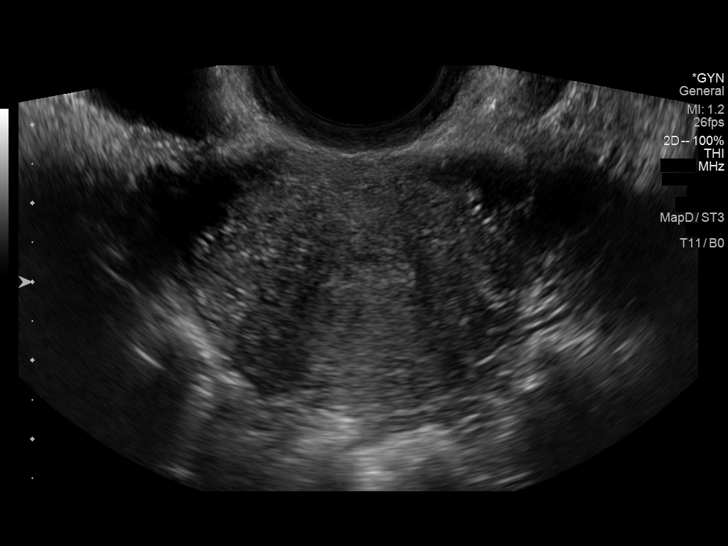
[im 54/93]
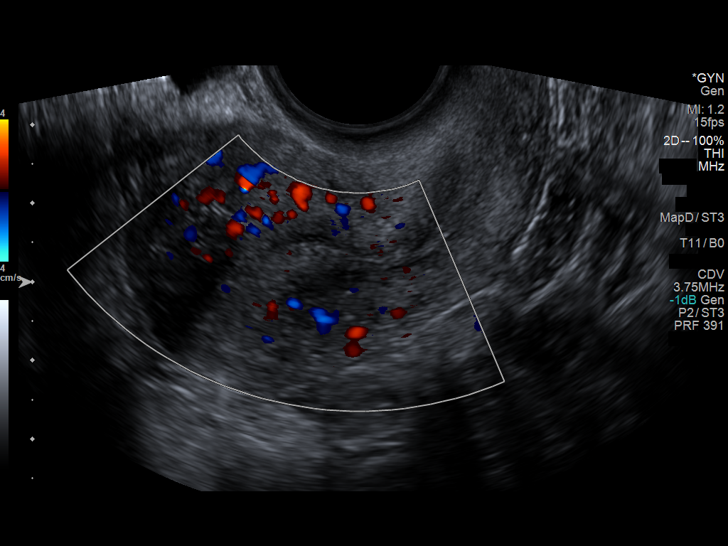
[im 62/93]
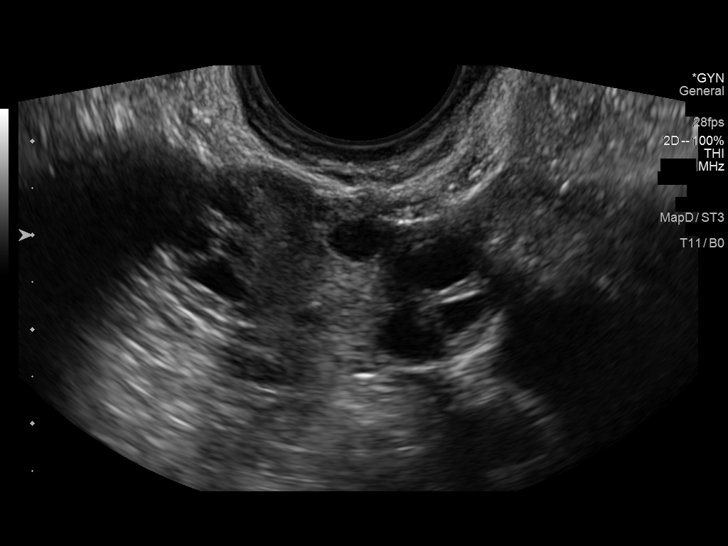
[im 70/93]
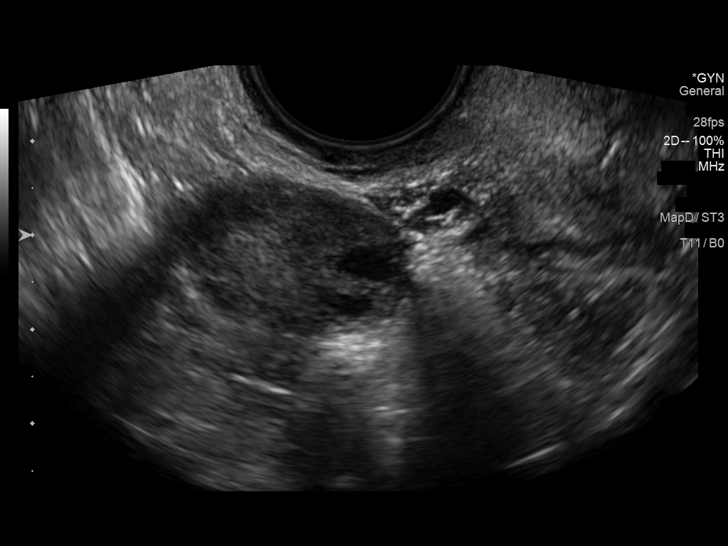
[im 77/93]
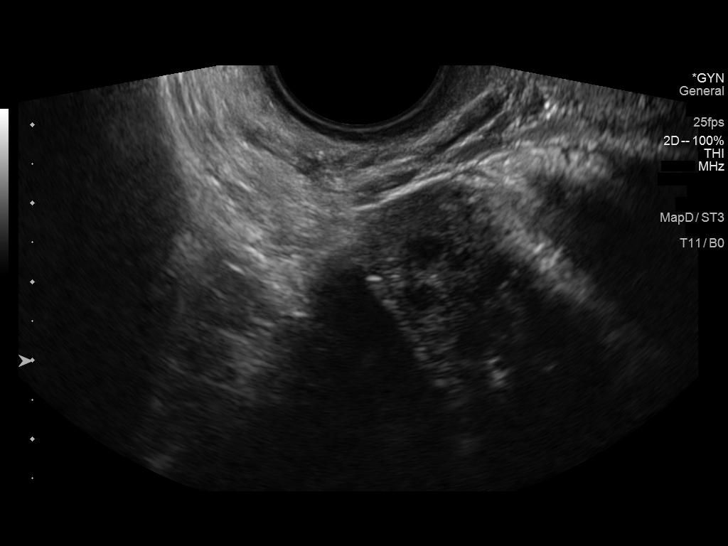
[im 85/93]
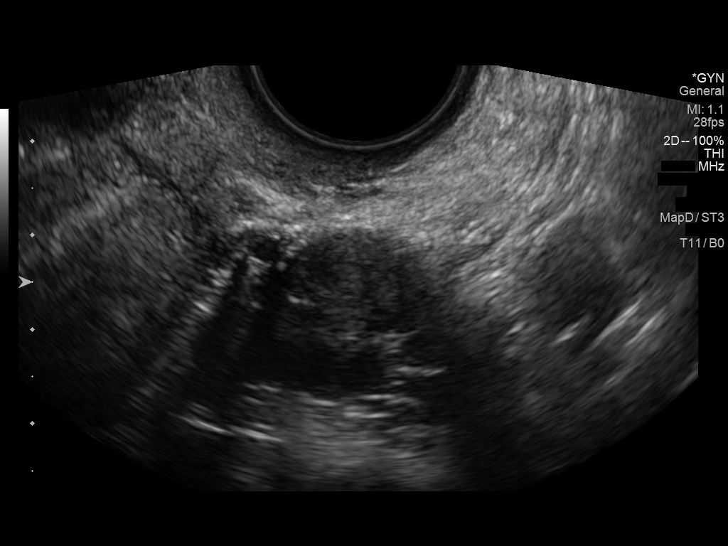
[im 93/93]
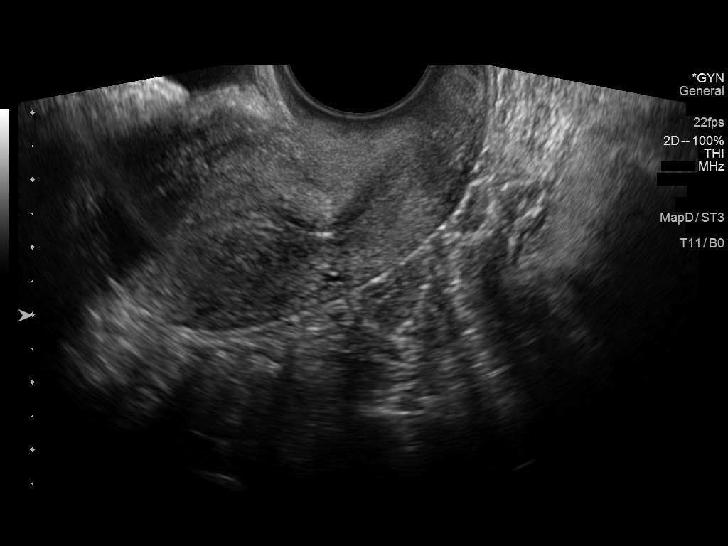

[13 of 25 positions shown; findings below may reference images not displayed]

FINDINGS: Uterus

Measurements: 71 x 33 x 41 mm. Heterogeneous echotexture. No focal
abnormalities.

Endometrium

Thickness: 5.5 mm.  No focal abnormality visualized.

Right ovary

Measurements: 37 x 19 x 23 mm. Normal appearance/no adnexal mass.

Left ovary

Measurements: 34 x 15 x 23 mm. Normal appearance/no adnexal mass.

Pulsed Doppler evaluation of both ovaries demonstrates normal
low-resistance arterial and venous waveforms.

Other findings

No free fluid.
IMPRESSION: No acute findings. No evidence of torsion. Heterogeneous myometrial
echotexture can be seen with adenomyosis as well as with diffuse
leiomyomatous involvement.

## 2015-12-16 HISTORY — PX: WISDOM TOOTH EXTRACTION: SHX21

## 2016-01-07 ENCOUNTER — Encounter: Payer: Self-pay | Admitting: Medical

## 2016-01-07 ENCOUNTER — Ambulatory Visit (INDEPENDENT_AMBULATORY_CARE_PROVIDER_SITE_OTHER): Payer: 59 | Admitting: Medical

## 2016-01-07 VITALS — BP 110/80 | HR 82 | Wt 118.0 lb

## 2016-01-07 DIAGNOSIS — K5900 Constipation, unspecified: Secondary | ICD-10-CM

## 2016-01-07 DIAGNOSIS — K921 Melena: Secondary | ICD-10-CM

## 2016-01-07 LAB — CBC WITH DIFFERENTIAL/PLATELET
BASOS ABS: 0 10*3/uL (ref 0.0–0.1)
BASOS PCT: 0 % (ref 0–1)
Eosinophils Absolute: 0.2 10*3/uL (ref 0.0–0.7)
Eosinophils Relative: 3 % (ref 0–5)
HEMATOCRIT: 40.1 % (ref 36.0–46.0)
HEMOGLOBIN: 13.2 g/dL (ref 12.0–15.0)
LYMPHS PCT: 29 % (ref 12–46)
Lymphs Abs: 2.2 10*3/uL (ref 0.7–4.0)
MCH: 30.5 pg (ref 26.0–34.0)
MCHC: 32.9 g/dL (ref 30.0–36.0)
MCV: 92.6 fL (ref 78.0–100.0)
MPV: 11.2 fL (ref 8.6–12.4)
Monocytes Absolute: 0.5 10*3/uL (ref 0.1–1.0)
Monocytes Relative: 7 % (ref 3–12)
NEUTROS PCT: 61 % (ref 43–77)
Neutro Abs: 4.6 10*3/uL (ref 1.7–7.7)
Platelets: 275 10*3/uL (ref 150–400)
RBC: 4.33 MIL/uL (ref 3.87–5.11)
RDW: 12.6 % (ref 11.5–15.5)
WBC: 7.6 10*3/uL (ref 4.0–10.5)

## 2016-01-07 MED ORDER — HYDROCORTISONE ACETATE 25 MG RE SUPP: 25.0000 mg | Freq: Two times a day (BID) | RECTAL | Status: DC

## 2016-01-07 MED ORDER — LINACLOTIDE 145 MCG PO CAPS: 145.0000 ug | ORAL_CAPSULE | Freq: Every day | ORAL | Status: DC

## 2016-01-07 NOTE — Progress Notes (Signed)
Subjective: Chief Complaint  Patient presents with  . Hemorrhoids    started about a few days ago, said that she is having heavy bleeding in the toilet with stomach pains. does not have normal bowel movements.    Here for possible hemorrhoid and blood in stool.  Had BM this morning with blood in toilet.  Has seen blood on the toilet paper for a few weeks, bright red.    Today had blood in the toilet, bright red.  This was the first time she had blood in bowl.  Has central abdominal pain intermittent.   Stays constipated, has BM typically every 2-3 days.  Uses fiber supplement, eating more fiber and vegetables.   Has had problems with constipation all her life.  Doesn't feel hemorrhoid sticking out.  No prior hemorrhoids.  Has pain with BMs , but not painful to wipe.  No heavy lifting.  Does strain on the toilet.  No prior prescription medication for hemorrhoids or constipation.   No hx/o GI problems in family.  No other aggravating or relieving factors. No other complaint.  Past Medical History  Diagnosis Date  . Allergy   . Eczema   . Asthma 05/2015    exercise induced, mild intermittent  . Contraception management     [facility], started [facility] 2016  . Encounter for routine gynecological examination     sees Dr. Renaldo Fiddler, [facility]   ROS as in subjective   Objective: BP 110/80 mmHg  Pulse 82  Wt 118 lb (53.524 kg)  LMP 12/10/2015  General appearance: alert, no distress, WD/WN Abdomen: +bs, soft, non tender, non distended, no masses, no hepatomegaly, no splenomegaly DRE: anus normal appearing, normal tone, anoscope performed.  There is internal hemorrhoids noted without obvious bleeding.  Patient tolerated procedure well.  Exam chaperoned by CMA   Assessment: Encounter Diagnoses  Name Primary?  . Constipation, unspecified constipation type Yes  . Blood in stool     Plan: Discussed differential.  Likely internal hemorrhoids from constipation, but can't rule  out other.  CBC today.   Begin several days of proctosol suppository for acute bleeding complaints, begin Linzess for constipation prevention. discussed risks/benefits.   F/u pending labs and on constipation in 4mo.  If bleeding doesn't improve in the next week, call or return.  Discussed other symptom or ongoing issues that may require GI consult, but for now likely just bleeding internal hemorrhoids.

## 2016-02-04 ENCOUNTER — Ambulatory Visit (INDEPENDENT_AMBULATORY_CARE_PROVIDER_SITE_OTHER): Payer: 59 | Admitting: Medical

## 2016-02-04 ENCOUNTER — Encounter: Payer: Self-pay | Admitting: Medical

## 2016-02-04 VITALS — BP 102/70 | HR 78 | Wt 114.0 lb

## 2016-02-04 DIAGNOSIS — K921 Melena: Secondary | ICD-10-CM | POA: Diagnosis not present

## 2016-02-04 DIAGNOSIS — K5900 Constipation, unspecified: Secondary | ICD-10-CM

## 2016-02-04 NOTE — Progress Notes (Signed)
Subjective: Chief Complaint  Patient presents with  . Follow-up    linzess and suppositories. suppositories went well and is not having anymore bleeding. is not going daily but when she does go her stool is softer. is having a "gurgly" stomach and is really gassy.    I saw her recently 01/07/16 for blood in stool, constipation.   At last visit she had seen blood on the toilet paper for a few weeks, bright red.   that was the first time she had blood in bowl.  Has central abdominal pain intermittent.   Stays constipated, has BM typically every 2-3 days.  Uses fiber supplement, eating more fiber and vegetables.   Has had problems with constipation all her life.  Doesn't feel hemorrhoid sticking out.  No prior hemorrhoids.  Has pain with BMs , but not painful to wipe.  No heavy lifting.  Does strain on the toilet.  No hx/o GI problems in family.  After last visit she used suppositories for several days.   hasn't had any more bleeding since last visit.   Still not having BM every day, but certainly is softer formed stool.   Overall improved on linzess, but now has occasional days of watery stool, lots of foul gas.   Every now and then medication disagrees with her.  No other aggravating or relieving factors. No other complaint.   Past Medical History  Diagnosis Date  . Allergy   . Eczema   . Asthma 05/2015    exercise induced, mild intermittent  . Contraception management     [facility], started [facility] 2016  . Encounter for routine gynecological examination     sees Dr. Renaldo Fiddler, [facility]   ROS as in subjective   Objective: BP 102/70 mmHg  Pulse 78  Wt 114 lb (51.71 kg)  LMP 02/03/2016  General appearance: alert, no distress, WD/WN Abdomen: +bs, soft, non tender, non distended, no masses, no hepatomegaly, no splenomegaly    Assessment: Encounter Diagnoses  Name Primary?  . Constipation, unspecified constipation type Yes  . Blood in stool     Plan: Stop Linzess  given loose stools and gas.   She prefers dietary changes and not having to take medication.   discussed fiber, water intake, discussed avoiding food triggers.  Glad to hear no additional blood in stool.    Labs today to further eval.  F/u pending labs  Loreen was seen today for follow-up.  Diagnoses and all orders for this visit:  Constipation, unspecified constipation type -     Comprehensive metabolic panel -     TSH -     Celiac panel  Blood in stool -     Comprehensive metabolic panel -     TSH -     Celiac panel

## 2016-02-04 NOTE — Patient Instructions (Signed)
Irritable Bowel Syndrome, Adult Irritable bowel syndrome (IBS) is not one specific disease. It is a group of symptoms that affects the organs responsible for digestion (gastrointestinal or GI tract).  To regulate how your GI tract works, your body sends signals back and forth between your intestines and your brain. If you have IBS, there may be a problem with these signals. As a result, your GI tract does not function normally. Your intestines may become more sensitive and overreact to certain things. This is especially true when you eat certain foods or when you are under stress.  There are four types of IBS. These may be determined based on the consistency of your stool:   IBS with diarrhea.   IBS with constipation.   Mixed IBS.   Unsubtyped IBS.  It is important to know which type of IBS you have. Some treatments are more likely to be helpful for certain types of IBS.  CAUSES  The exact cause of IBS is not known. RISK FACTORS You may have a higher risk of IBS if:  You are a woman.  You are younger than 22 years old.  You have a family history of IBS.  You have mental health problems.  You have had bacterial infection of your GI tract. SIGNS AND SYMPTOMS  Symptoms of IBS vary from person to person. The main symptom is abdominal pain or discomfort. Additional symptoms usually include one or more of the following:   Diarrhea, constipation, or both.   Abdominal swelling or bloating.   Feeling full or sick after eating a small or regular-size meal.   Frequent gas.   Mucus in the stool.   A feeling of having more stool left after a bowel movement.  Symptoms tend to come and go. They may be associated with stress, psychiatric conditions, or nothing at all.  DIAGNOSIS  There is no specific test to diagnose IBS. Your health care provider will make a diagnosis based on a physical exam, medical history, and your symptoms. You may have other tests to rule out other  conditions that may be causing your symptoms. These may include:   Blood tests.   X-rays.   CT scan.  Endoscopy and colonoscopy. This is a test in which your GI tract is viewed with a long, thin, flexible tube. TREATMENT There is no cure for IBS, but treatment can help relieve symptoms. IBS treatment often includes:   Changes to your diet, such as:  Eating more fiber.  Avoiding foods that cause symptoms.  Drinking more water.  Eating regular, medium-sized portioned meals.  Medicines. These may include:  Fiber supplements if you have constipation.  Medicine to control diarrhea (antidiarrheal medicines).  Medicine to help control muscle spasms in your GI tract (antispasmodic medicines).  Medicines to help with any mental health issues, such as antidepressants or tranquilizers.  Therapy.  Talk therapy may help with anxiety, depression, or other mental health issues that can make IBS symptoms worse.  Stress reduction.  Managing your stress can help keep symptoms under control. HOME CARE INSTRUCTIONS   Take medicines only as directed by your health care provider.  Eat a healthy diet.  Avoid foods and drinks with added sugar.  Include more whole grains, fruits, and vegetables gradually into your diet. This may be especially helpful if you have IBS with constipation.  Avoid any foods and drinks that make your symptoms worse. These may include dairy products and caffeinated or carbonated drinks.  Do not eat large meals.    Drink enough fluid to keep your urine clear or pale yellow.  Exercise regularly. Ask your health care provider for recommendations of good activities for you.  Keep all follow-up visits as directed by your health care provider. This is important. SEEK MEDICAL CARE IF:   You have constant pain.  You have trouble or pain with swallowing.  You have worsening diarrhea. SEEK IMMEDIATE MEDICAL CARE IF:   You have severe and worsening abdominal  pain.   You have diarrhea and:   You have a rash, stiff neck, or severe headache.   You are irritable, sleepy, or difficult to awaken.   You are weak, dizzy, or extremely thirsty.   You have bright red blood in your stool or you have black tarry stools.   You have unusual abdominal swelling that is painful.   You vomit continuously.   You vomit blood (hematemesis).   You have both abdominal pain and a fever.    This information is not intended to replace advice given to you by your health care provider. Make sure you discuss any questions you have with your health care provider.   Document Released: 12/01/2005 Document Revised: 12/22/2014 Document Reviewed: 08/18/2014 Elsevier Interactive Patient Education 2016 [employer]  

## 2016-02-05 LAB — COMPREHENSIVE METABOLIC PANEL
ALBUMIN: 4.1 g/dL (ref 3.6–5.1)
ALT: 13 U/L (ref 6–29)
AST: 18 U/L (ref 10–30)
Alkaline Phosphatase: 50 U/L (ref 33–115)
BUN: 10 mg/dL (ref 7–25)
CALCIUM: 9.1 mg/dL (ref 8.6–10.2)
CHLORIDE: 106 mmol/L (ref 98–110)
CO2: 25 mmol/L (ref 20–31)
CREATININE: 0.67 mg/dL (ref 0.50–1.10)
Glucose, Bld: 84 mg/dL (ref 65–99)
Potassium: 4.5 mmol/L (ref 3.5–5.3)
Sodium: 141 mmol/L (ref 135–146)
TOTAL PROTEIN: 6.9 g/dL (ref 6.1–8.1)
Total Bilirubin: 0.5 mg/dL (ref 0.2–1.2)

## 2016-02-05 LAB — GLIA (IGA/G) + TTG IGA
GLIADIN IGG: 2 U (ref <20)
Gliadin IgA: 3 Units (ref <20)
Tissue Transglutaminase Ab, IgA: 1 U/mL (ref <4)

## 2016-02-05 LAB — TSH: TSH: 0.94 m[IU]/L

## 2017-10-21 LAB — HM PAP SMEAR: HM PAP: NEGATIVE

## 2017-12-15 HISTORY — PX: SCLEROTHERAPY: SHX6841

## 2018-05-05 ENCOUNTER — Telehealth: Payer: Self-pay | Admitting: Medical

## 2018-05-05 NOTE — Telephone Encounter (Signed)
Called pt and left message to call back to schedule a CPE or FU appt per Vincenza Hews

## 2018-05-05 NOTE — Telephone Encounter (Signed)
I received [facility] from her recent visit there.    Encourage her to come here as she is established with [place] for primary care.   I haven't seen her since 2017 for physical, so I recommend physical or follow up here.     We usually have available to get her in quickly for acute issue as well.

## 2018-08-23 ENCOUNTER — Encounter: Payer: Self-pay | Admitting: Medical

## 2018-08-23 ENCOUNTER — Ambulatory Visit: Payer: 59 | Admitting: Medical

## 2018-08-23 VITALS — BP 100/60 | HR 66 | Temp 98.5°F | Resp 16 | Ht 60.0 in | Wt 108.6 lb

## 2018-08-23 DIAGNOSIS — Z7185 Encounter for immunization safety counseling: Secondary | ICD-10-CM | POA: Insufficient documentation

## 2018-08-23 DIAGNOSIS — Z23 Encounter for immunization: Secondary | ICD-10-CM | POA: Insufficient documentation

## 2018-08-23 DIAGNOSIS — Z0000 Encounter for general adult medical examination without abnormal findings: Secondary | ICD-10-CM | POA: Diagnosis not present

## 2018-08-23 DIAGNOSIS — Z7189 Other specified counseling: Secondary | ICD-10-CM | POA: Diagnosis not present

## 2018-08-23 LAB — POCT URINALYSIS DIP (PROADVANTAGE DEVICE)
Bilirubin, UA: NEGATIVE
Glucose, UA: NEGATIVE mg/dL
Ketones, POC UA: NEGATIVE mg/dL
Leukocytes, UA: NEGATIVE
Nitrite, UA: NEGATIVE
Protein Ur, POC: NEGATIVE mg/dL
RBC UA: NEGATIVE
Specific Gravity, Urine: 1.005
Urobilinogen, Ur: NEGATIVE
pH, UA: 8 (ref 5.0–8.0)

## 2018-08-23 NOTE — Progress Notes (Signed)
Subjective:   HPI  Kelly Dean is a 22 y.o. female who presents for Chief Complaint  Patient presents with  . CPE    cpe fasting patinet has gyn    Medical care team includes: Teresa Nicodemus, Kermit Balo, PA-C here for primary care Dentist Eye doctor  Concerns:  Currently being treated for dental abscess.    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   Had gyn physical 10/2018, and they did labs.  Had lipid panel 10/2018.  LDL 115s, HDL good.    Reviewed their medical, surgical, family, social, medication, and allergy history and updated chart as appropriate.  Past Medical History:  Diagnosis Date  . Allergy   . Asthma 05/2015   exercise induced, mild intermittent  . Contraception management    [facility], started [facility] 2016  . Eczema   . Encounter for routine gynecological examination    sees Dr. Renaldo Fiddler, [facility]    Past Surgical History:  Procedure Laterality Date  . WISDOM TOOTH EXTRACTION  2017    Social History   Socioeconomic History  . Marital status: Single    Spouse name: Not on file  . Number of children: Not on file  . Years of education: Not on file  . Highest education level: Not on file  Occupational History  . Not on file  Social Needs  . Financial resource strain: Not on file  . Food insecurity:    Worry: Not on file    Inability: Not on file  . Transportation needs:    Medical: Not on file    Non-medical: Not on file  Tobacco Use  . Smoking status: Never Smoker  . Smokeless tobacco: Never Used  Substance and Sexual Activity  . Alcohol use: Yes    Alcohol/week: 1.0 standard drinks    Types: 1 Cans of beer per week  . Drug use: No  . Sexual activity: Not on file    Comment: senior, wants to go to [employer], wants to do ultrasound tech  Lifestyle  . Physical activity:    Days per week: Not on file    Minutes per session: Not on file  . Stress: Not on file  Relationships  .  Social connections:    Talks on phone: Not on file    Gets together: Not on file    Attends religious service: Not on file    Active member of club or organization: Not on file    Attends meetings of clubs or organizations: Not on file    Relationship status: Not on file  . Intimate partner violence:    Fear of current or ex partner: Not on file    Emotionally abused: Not on file    Physically abused: Not on file    Forced sexual activity: Not on file  Other Topics Concern  . Not on file  Social History Narrative   Living in [place].  Was at [employer] for [employer] school, working as Engineer, civil (consulting) at [employer].   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    Thinking about NP school.  Goes to gym 4-5 times per week, 45 min cardio, weights.   Single.  08/2018    Family History  Problem Relation Age of Onset  . Hypertension Father   . Hyperlipidemia Father   . Migraines Mother   . Hyperlipidemia Mother   . Arthritis Paternal Grandmother   .  Cancer Paternal Grandmother   . Asthma Paternal Grandmother   . Hyperlipidemia Paternal Grandmother   . Hypertension Paternal Grandmother   . Heart disease Paternal Grandfather 80  . Hyperlipidemia Paternal Grandfather   . Hypertension Paternal Grandfather   . Cancer Paternal Aunt        breast  . Cancer Paternal Uncle        prostate  . Heart disease Maternal Grandmother        surgery for PDA age 12, and aortic valve replacement at 71  . Cancer Maternal Grandmother        liposarcoma  . Hearing loss Maternal Grandmother   . Stroke Neg Hx   . Diabetes Neg Hx      Current Outpatient Medications:  .  acetaminophen (TYLENOL) 325 MG tablet, Take 650 mg by mouth every 6 (six) hours as needed. Reported on 02/04/2016, Disp: , Rfl:  .  clindamycin (CLEOCIN) 300 MG capsule, Take 300 capsules by mouth 3 (three) times daily., Disp: , Rfl:  .  FIBER PO, Take 2 tablets by mouth daily., Disp: , Rfl:  .  Multiple  Vitamins-Minerals (MULTIVITAMIN WITH MINERALS) tablet, Take 1 tablet by mouth daily., Disp: , Rfl:  .  PORTIA-28 0.15-30 MG-MCG tablet, Take 1 tablet by mouth daily., Disp: , Rfl: 4 .  Calcium-Magnesium-Vitamin D (CALCIUM 500 PO), Take 2 tablets by mouth., Disp: , Rfl:   Allergies  Allergen Reactions  . Penicillins Rash and Hives  . Shellfish Allergy      Review of Systems Constitutional: -fever, -chills, -sweats, -unexpected weight change, -decreased appetite, -fatigue Allergy: -sneezing, -itching, -congestion Dermatology: -changing moles, --rash, -lumps ENT: -runny nose, -ear pain, -sore throat, -hoarseness, -sinus pain, +teeth pain, - ringing in ears, -hearing loss, -nosebleeds Cardiology: -chest pain, -palpitations, -swelling, -difficulty breathing when lying flat, -waking up short of breath Respiratory: -cough, -shortness of breath, -difficulty breathing with exercise or exertion, -wheezing, -coughing up blood Gastroenterology: -abdominal pain, -nausea, -vomiting, -diarrhea, -constipation, -blood in stool, -changes in bowel movement, -difficulty swallowing or eating Hematology: -bleeding, -bruising  Musculoskeletal: -joint aches, -muscle aches, -joint swelling, -back pain, -neck pain, -cramping, -changes in gait Ophthalmology: denies vision changes, eye redness, itching, discharge Urology: -burning with urination, -difficulty urinating, -blood in urine, -urinary frequency, -urgency, -incontinence Neurology: -headache, -weakness, -tingling, -numbness, -memory loss, -falls, -dizziness Psychology: -depressed mood, -agitation, -sleep problems Breast/gyn: -breast tendnerss, -discharge, -lumps, -vaginal discharge,- irregular periods, -heavy periods     Objective:  BP 100/60   Pulse 66   Temp 98.5 F (36.9 C) (Oral)   Resp 16   Ht 5' (1.524 m)   Wt 108 lb 9.6 oz (49.3 kg)   LMP 08/09/2018 (LMP Unknown)   SpO2 98%   BMI 21.21 kg/m   General appearance: alert, no distress, WD/WN,  Caucasian female Skin: unremarkable HEENT: normocephalic, conjunctiva/corneas normal, sclerae anicteric, PERRLA, EOMi, nares patent, no discharge or erythema, pharynx normal Oral cavity: MMM, tongue normal, teeth normal Neck: supple, no lymphadenopathy, no thyromegaly, no masses, normal ROM, no bruits Chest: non tender, normal shape and expansion Heart: RRR, normal S1, S2, no murmurs Lungs: CTA bilaterally, no wheezes, rhonchi, or rales Abdomen: +bs, soft, non tender, non distended, no masses, no hepatomegaly, no splenomegaly, no bruits Back: non tender, normal ROM, no scoliosis Musculoskeletal: upper extremities non tender, no obvious deformity, normal ROM throughout, lower extremities non tender, no obvious deformity, normal ROM throughout Extremities: no edema, no cyanosis, no clubbing Pulses: 2+ symmetric, upper and lower extremities, normal cap refill  Neurological: alert, oriented x 3, CN2-12 intact, strength normal upper extremities and lower extremities, sensation normal throughout, DTRs 2+ throughout, no cerebellar signs, gait normal Psychiatric: normal affect, behavior normal, pleasant  Breast/gyn/rectal - deferred to gynecology   Assessment and Plan :   Encounter Diagnoses  Name Primary?  . Routine general medical examination at a health care facility Yes  . Vaccine counseling     Physical exam - discussed and counseled on healthy lifestyle, diet, exercise, preventative care, vaccinations, sick and well care, proper use of emergency dept and after hours care, and addressed their concerns.    Health screening: See your eye doctor yearly for routine vision care. See your dentist yearly for routine dental care including hygiene visits twice yearly.  Cancer screening Discussed pap smear recommendations.   Vaccinations: Advised yearly influenza vaccine She will get this free at work  Strongly advised HPV vaccine.  She will consider.  We will request last labs and pap from  gyn.    Tylynn was seen today for cpe.  Diagnoses and all orders for this visit:  Routine general medical examination at a health care facility -     POCT Urinalysis DIP (Proadvantage Device)  Vaccine counseling  Other orders -     Cancel: Flu Vaccine QUAD 6+ mos PF IM (Fluarix Quad PF)   Follow-up pending labs, yearly for physical

## 2018-08-23 NOTE — Patient Instructions (Signed)
Thanks for trusting [place] with your health care and for coming in for a physical today.  Below are some general recommendations I have for you:  Yearly screenings See your eye doctor yearly for routine vision care. See your dentist yearly for routine dental care including hygiene visits twice yearly. See me here yearly for a routine physical and preventative care visit  Specific Concerns today:  . I strongly recommend the HPV human papilloma virus vaccine . You can get this hear or gynecology . Make sure you send a copy of the documentation for flu shot here.   Please follow up yearly for a physical.    I have included other useful information below for your review.  Preventative Care for Adults - Female      MAINTAIN REGULAR HEALTH EXAMS:  A routine yearly physical is a good way to check in with your primary care provider about your health and preventive screening. It is also an opportunity to share updates about your health and any concerns you have, and receive a thorough all-over exam.   Most health insurance companies pay for at least some preventative services.  Check with your health plan for specific coverages.  WHAT PREVENTATIVE SERVICES DO WOMEN NEED?  Adult women should have their weight and blood pressure checked regularly.   Women age 20 and older should have their cholesterol levels checked regularly.  Women should be screened for cervical cancer with a Pap smear and pelvic exam beginning at either age 82, or 3 years after they become sexually activity.    Breast cancer screening generally begins at age 52 with a mammogram and breast exam by your primary care provider.    Beginning at age 61 and continuing to age 76, women should be screened for colorectal cancer.  Certain people may need continued testing until age 79.  Updating vaccinations is part of preventative care.  Vaccinations help protect against diseases such as the flu.  Osteoporosis is a disease in  which the bones lose minerals and strength as we age. Women ages 35 and over should discuss this with their caregivers, as should women after menopause who have other risk factors.  Lab tests are generally done as part of preventative care to screen for anemia and blood disorders, to screen for problems with the kidneys and liver, to screen for bladder problems, to check blood sugar, and to check your cholesterol level.  Preventative services generally include counseling about diet, exercise, avoiding tobacco, drugs, excessive alcohol consumption, and sexually transmitted infections.    GENERAL RECOMMENDATIONS FOR GOOD HEALTH:  Healthy diet:  Eat a variety of foods, including fruit, vegetables, animal or vegetable protein, such as meat, fish, chicken, and eggs, or beans, lentils, tofu, and grains, such as rice.  Drink plenty of water daily.  Decrease saturated fat in the diet, avoid lots of red meat, processed foods, sweets, fast foods, and fried foods.  Exercise:  Aerobic exercise helps maintain good heart health. At least 30-40 minutes of moderate-intensity exercise is recommended. For example, a brisk walk that increases your heart rate and breathing. This should be done on most days of the week.   Find a type of exercise or a variety of exercises that you enjoy so that it becomes a part of your daily life.  Examples are running, walking, swimming, water aerobics, and biking.  For motivation and support, explore group exercise such as aerobic class, spin class, Zumba, Yoga,or  martial arts, etc.    Set  exercise goals for yourself, such as a certain weight goal, walk or run in a race such as a 5k walk/run.  Speak to your primary care provider about exercise goals.  Disease prevention:  If you smoke or chew tobacco, find out from your caregiver how to quit. It can literally save your life, no matter how long you have been a tobacco user. If you do not use tobacco, never begin.   Maintain  a healthy diet and normal weight. Increased weight leads to problems with blood pressure and diabetes.   The Body Mass Index or BMI is a way of measuring how much of your body is fat. Having a BMI above 27 increases the risk of heart disease, diabetes, hypertension, stroke and other problems related to obesity. Your caregiver can help determine your BMI and based on it develop an exercise and dietary program to help you achieve or maintain this important measurement at a healthful level.  High blood pressure causes heart and blood vessel problems.  Persistent high blood pressure should be treated with medicine if weight loss and exercise do not work.   Fat and cholesterol leaves deposits in your arteries that can block them. This causes heart disease and vessel disease elsewhere in your body.  If your cholesterol is found to be high, or if you have heart disease or certain other medical conditions, then you may need to have your cholesterol monitored frequently and be treated with medication.   Ask if you should have a cardiac stress test if your history suggests this. A stress test is a test done on a treadmill that looks for heart disease. This test can find disease prior to there being a problem.  Menopause can be associated with physical symptoms and risks. Hormone replacement therapy is available to decrease these. You should talk to your caregiver about whether starting or continuing to take hormones is right for you.   Osteoporosis is a disease in which the bones lose minerals and strength as we age. This can result in serious bone fractures. Risk of osteoporosis can be identified using a bone density scan. Women ages 18 and over should discuss this with their caregivers, as should women after menopause who have other risk factors. Ask your caregiver whether you should be taking a calcium supplement and Vitamin D, to reduce the rate of osteoporosis.   Avoid drinking alcohol in excess (more than  two drinks per day).  Avoid use of street drugs. Do not share needles with anyone. Ask for professional help if you need assistance or instructions on stopping the use of alcohol, cigarettes, and/or drugs.  Brush your teeth twice a day with fluoride toothpaste, and floss once a day. Good oral hygiene prevents tooth decay and gum disease. The problems can be painful, unattractive, and can cause other health problems. Visit your dentist for a routine oral and dental check up and preventive care every 6-12 months.   Look at your skin regularly.  Use a mirror to look at your back. Notify your caregivers of changes in moles, especially if there are changes in shapes, colors, a size larger than a pencil eraser, an irregular border, or development of new moles.  Safety:  Use seatbelts 100% of the time, whether driving or as a passenger.  Use safety devices such as hearing protection if you work in environments with loud noise or significant background noise.  Use safety glasses when doing any work that could send debris in to the eyes.  Use a helmet if you ride a bike or motorcycle.  Use appropriate safety gear for contact sports.  Talk to your caregiver about gun safety.  Use sunscreen with a SPF (or skin protection factor) of 15 or greater.  Lighter skinned people are at a greater risk of skin cancer. Don't forget to also wear sunglasses in order to protect your eyes from too much damaging sunlight. Damaging sunlight can accelerate cataract formation.   Practice safe sex. Use condoms. Condoms are used for birth control and to help reduce the spread of sexually transmitted infections (or STIs).  Some of the STIs are gonorrhea (the clap), chlamydia, syphilis, trichomonas, herpes, HPV (human papilloma virus) and HIV (human immunodeficiency virus) which causes AIDS. The herpes, HIV and HPV are viral illnesses that have no cure. These can result in disability, cancer and death.   Keep carbon monoxide and smoke  detectors in your home functioning at all times. Change the batteries every 6 months or use a model that plugs into the wall.   Vaccinations:  Stay up to date with your tetanus shots and other required immunizations. You should have a booster for tetanus every 10 years. Be sure to get your flu shot every year, since 5%-20% of the [place] population comes down with the flu. The flu vaccine changes each year, so being vaccinated once is not enough. Get your shot in the fall, before the flu season peaks.   Other vaccines to consider:  Human Papilloma Virus or HPV causes cancer of the cervix, and other infections that can be transmitted from person to person. There is a vaccine for HPV, and females should get immunized between the ages of 25 and 103. It requires a series of 3 shots.   Pneumococcal vaccine to protect against certain types of pneumonia.  This is normally recommended for adults age 26 or older.  However, adults younger than 24 years old with certain underlying conditions such as diabetes, heart or lung disease should also receive the vaccine.  Shingles vaccine to protect against Varicella Zoster if you are older than age 37, or younger than 24 years old with certain underlying illness.  If you have not had the Shingrix vaccine, please call your insurer to inquire about coverage for the Shingrix vaccine given in 2 doses.   Some insurers cover this vaccine after age 17, some cover this after age 27.  If your insurer covers this, then call to schedule appointment to have this vaccine here  Hepatitis A vaccine to protect against a form of infection of the liver by a virus acquired from food.  Hepatitis B vaccine to protect against a form of infection of the liver by a virus acquired from blood or body fluids, particularly if you work in health care.  If you plan to travel internationally, check with your local health department for specific vaccination recommendations.  Cancer  Screening:  Breast cancer screening is essential to preventive care for women. All women age 2 and older should perform a breast self-exam every month. At age 32 and older, women should have their caregiver complete a breast exam each year. Women at ages 77 and older should have a mammogram (x-ray film) of the breasts. Your caregiver can discuss how often you need mammograms.    Cervical cancer screening includes taking a Pap smear (sample of cells examined under a microscope) from the cervix (end of the uterus). It also includes testing for HPV (Human Papilloma Virus, which can cause cervical  cancer). Screening and a pelvic exam should begin at age 95, or 3 years after a woman becomes sexually active. Screening should occur every year, with a Pap smear but no HPV testing, up to age 73. After age 6, you should have a Pap smear every 3 years with HPV testing, if no HPV was found previously.   Most routine colon cancer screening begins at the age of 54. On a yearly basis, doctors may provide special easy to use take-home tests to check for hidden blood in the stool. Sigmoidoscopy or colonoscopy can detect the earliest forms of colon cancer and is life saving. These tests use a small camera at the end of a tube to directly examine the colon. Speak to your caregiver about this at age 104, when routine screening begins (and is repeated every 5 years unless early forms of pre-cancerous polyps or small growths are found).

## 2018-09-22 ENCOUNTER — Telehealth: Payer: Self-pay | Admitting: Medical

## 2018-09-22 NOTE — Telephone Encounter (Signed)
Pap was sent and requested not mammogram

## 2018-09-22 NOTE — Telephone Encounter (Signed)
Received requested mammogram from [facility]. Sending back for review.

## 2018-09-29 ENCOUNTER — Encounter: Payer: Self-pay | Admitting: Internal Medicine

## 2019-04-01 ENCOUNTER — Ambulatory Visit: Payer: 59 | Admitting: Medical

## 2019-04-01 ENCOUNTER — Telehealth: Payer: Self-pay | Admitting: Family Medicine

## 2019-04-01 ENCOUNTER — Other Ambulatory Visit: Payer: Self-pay

## 2019-04-01 ENCOUNTER — Encounter: Payer: Self-pay | Admitting: Medical

## 2019-04-01 VITALS — Temp 100.0°F | Ht 60.0 in | Wt 108.0 lb

## 2019-04-01 DIAGNOSIS — J039 Acute tonsillitis, unspecified: Secondary | ICD-10-CM | POA: Diagnosis not present

## 2019-04-01 MED ORDER — AZITHROMYCIN 250 MG PO TABS: ORAL_TABLET | ORAL | 0 refills | Status: DC

## 2019-04-01 NOTE — Telephone Encounter (Signed)
Left a message for pt to call. Pt needs a virtual visit per [place].

## 2019-04-01 NOTE — Progress Notes (Signed)
Subjective:     Patient ID: Kelly Dean, female   DOB: 01/12/1996, 23 y.o.   MRN: [id number]  This visit type was conducted due to national recommendations for restrictions regarding the COVID-19 Pandemic (e.g. social distancing) in an effort to limit this patient's exposure and mitigate transmission in our community.  Due to their co-morbid illnesses, this patient is at least at moderate risk for complications without adequate follow up.  This format is felt to be most appropriate for this patient at this time.    Documentation for virtual audio and video telecommunications through Zoom encounter:  The patient was located at home. The provider was located in the office. The patient did consent to this visit and is aware of possible charges through their insurance for this visit.  The other persons participating in this telemedicine service were none. Time spent on call was 12  minutes and in review of previous records >15 minutes total.  This virtual service is not related to other E/M service within previous 7 days.   HPI Chief Complaint  Patient presents with  . Fever    sore throat, fever, white spots on tonsil, tired, nodes tender and sore, palpable X 1day   Virtual visit today for sore throat.   Started 1 day ago with sore throat.  She reports sore throat, swollen lymph nodes on the right, right sided sore throat initially but now both sides.   Tonsils swollen, somewhat red, white pus pockets on tonsils.   fatigue, 100.5 temp.    Used some Nyquil.   This morning temp was 100.  No sinus pressure.   No headache.  No cough.   No NVD.  No chills.   Achy but did new work out routine the other day.   No sick contacts.   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  No sick contacts in household.   Has boyfriend and he hasn't been sick.      She was treated for bronchitis and right sided sore throat in 01/2019, but otherwise hasn't been getting recurrent illness.  No allergy  problems, no hx/o seasonal allergies, no itchy watery eyes, no stuffy nose. No other aggravating or relieving factors. No other complaint.   Past Medical History:  Diagnosis Date  . Allergy   . Asthma 05/2015   exercise induced, mild intermittent  . Contraception management    [facility], [facility]  . Eczema   . Encounter for routine gynecological examination    sees Dr. Renaldo Fiddler, [facility]   Current Outpatient Medications on File Prior to Visit  Medication Sig Dispense Refill  . FIBER PO Take 2 tablets by mouth daily.    . Multiple Vitamins-Minerals (MULTIVITAMIN WITH MINERALS) tablet Take 1 tablet by mouth daily.    Marland Kitchen PORTIA-28 0.15-30 MG-MCG tablet Take 1 tablet by mouth daily.  4  . acetaminophen (TYLENOL) 325 MG tablet Take 650 mg by mouth every 6 (six) hours as needed. Reported on 02/04/2016    . Calcium-Magnesium-Vitamin D (CALCIUM 500 PO) Take 2 tablets by mouth.     No current facility-administered medications on file prior to visit.     Review of Systems As in subjective    Objective:   Physical Exam Due to coronavirus pandemic stay at home measures, patient visit was virtual and they were not examined in person.   Gen: wd, wn, nad     Assessment:     Encounter Diagnosis  Name Primary?  . Tonsillitis Yes       Plan:     We discussed limitations of virtual encounter.  She is going to try to send better still pictures of her throat through my chart, but based on symptoms, etiology most likely tonsillitis.   Begin azithromycin, rest, hydrate well, can use salt water gargles, warm fluids, ibuprofen or tylenol OTC for pain and fever.    Gave work note, and advised isolation for the next 48 hours, or until afebrile x 24 hours after starting antibiotic.   Discussed prevention, means of transmission.   discussed possible complications that would prompt recheck such as peritonsillar abscess.    Kelly Dean was seen today for fever.  Diagnoses and  all orders for this visit:  Tonsillitis  Other orders -     azithromycin (ZITHROMAX) 250 MG tablet; 2 tablets day 1, then 1 tablet days 2-4

## 2019-08-24 ENCOUNTER — Other Ambulatory Visit: Payer: Self-pay | Admitting: Medical

## 2019-08-24 ENCOUNTER — Telehealth: Payer: Self-pay | Admitting: Medical

## 2019-08-24 DIAGNOSIS — Z0000 Encounter for general adult medical examination without abnormal findings: Secondary | ICD-10-CM

## 2019-08-24 NOTE — Telephone Encounter (Signed)
Labs in, have Verdis Frederickson draw some extra tubes in case

## 2019-08-24 NOTE — Telephone Encounter (Signed)
Pt called she has a cpe tomorrow morning and wants to know if she can have labs drawn as hell.

## 2019-08-25 ENCOUNTER — Ambulatory Visit: Payer: 59 | Admitting: Medical

## 2019-08-25 ENCOUNTER — Other Ambulatory Visit: Payer: Self-pay

## 2019-08-25 ENCOUNTER — Encounter: Payer: Self-pay | Admitting: Medical

## 2019-08-25 VITALS — BP 90/62 | HR 64 | Temp 98.1°F | Ht 60.0 in | Wt 109.0 lb

## 2019-08-25 DIAGNOSIS — Z0000 Encounter for general adult medical examination without abnormal findings: Secondary | ICD-10-CM | POA: Diagnosis not present

## 2019-08-25 DIAGNOSIS — Z23 Encounter for immunization: Secondary | ICD-10-CM | POA: Insufficient documentation

## 2019-08-25 DIAGNOSIS — Z83438 Family history of other disorder of lipoprotein metabolism and other lipidemia: Secondary | ICD-10-CM | POA: Diagnosis not present

## 2019-08-25 DIAGNOSIS — A6000 Herpesviral infection of urogenital system, unspecified: Secondary | ICD-10-CM

## 2019-08-25 NOTE — Progress Notes (Signed)
Gave to genera to fill out

## 2019-08-25 NOTE — Progress Notes (Signed)
Subjective:   HPI  Kelly Dean is a 23 y.o. female who presents for Chief Complaint  Patient presents with  . Annual Exam    with fasting labs     Patient Care Team: Prabhjot Maddux, Kermit Baloavid S, PA-C as PCP - General (Family Medicine) Sees dentist Sees eye doctor  Concerns: Exercise 4 days per week, does weights 4 days per week. Has trainer, [employer]  Hx/o hsv, 1 prior outbreak. Has valtrex on hand.  Sees gyn, up to date on pap  No recent asthma issues   Reviewed their medical, surgical, family, social, medication, and allergy history and updated chart as appropriate.  Past Medical History:  Diagnosis Date  . Allergy   . Asthma 05/2015   exercise induced, mild intermittent  . Contraception management    [facility], started [facility] 2016  . Eczema   . Encounter for routine gynecological examination    sees Dr. Renaldo FiddlerAdkins, [facility]    Past Surgical History:  Procedure Laterality Date  . SCLEROTHERAPY  2019   vein therapy for varicosities/spider veins  . WISDOM TOOTH EXTRACTION  2017    Social History   Socioeconomic History  . Marital status: Single    Spouse name: Not on file  . Number of children: Not on file  . Years of education: Not on file  . Highest education level: Not on file  Occupational History  . Not on file  Social Needs  . Financial resource strain: Not on file  . Food insecurity    Worry: Not on file    Inability: Not on file  . Transportation needs    Medical: Not on file    Non-medical: Not on file  Tobacco Use  . Smoking status: Never Smoker  . Smokeless tobacco: Never Used  Substance and Sexual Activity  . Alcohol use: Yes    Alcohol/week: 1.0 standard drinks    Types: 1 Cans of beer per week    Comment: occasional  . Drug use: No  . 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 Physical activity    Days per week: Not on file    Minutes  per session: Not on file  . Stress: Not on file  Relationships  . Social Musicianconnections    Talks on phone: Not on file    Gets together: Not on file    Attends religious service: Not on file    Active member of club or organization: Not on file    Attends meetings of clubs or organizations: Not on file    Relationship status: Not on file  . Intimate partner violence    Fear of current or ex partner: Not on file    Emotionally abused: Not on file    Physically abused: Not on file    Forced sexual activity: Not on file  Other Topics Concern  . Not on file  Social History Narrative   [place] in [place]  [facility] [employer], working as Engineer, civil (consulting)nurse at [employer].  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    Thinking about NP school.  Goes to gym 4 times per week, 45 min cardio, weights.   Single.  No children.  08/2019    Family History  Problem Relation Age of Onset  . Hypertension Father   . Hyperlipidemia Father   .  Migraines Mother   . Hyperlipidemia Mother   . Arthritis Paternal Grandmother   . Cancer Paternal Grandmother   . Asthma Paternal Grandmother   . Hyperlipidemia Paternal Grandmother   . Hypertension Paternal Grandmother   . Obesity Paternal Grandmother   . Heart disease Paternal Grandfather 83  . Hyperlipidemia Paternal Grandfather   . Hypertension Paternal Grandfather   . Cancer Paternal Aunt        breast  . Cancer Paternal Uncle        prostate  . Heart disease Maternal Grandmother        surgery for PDA age 57, and aortic valve replacement at 49  . Cancer Maternal Grandmother        liposarcoma  . Hearing loss Maternal Grandmother   . Stroke Neg Hx   . Diabetes Neg Hx      Current Outpatient Medications:  .  FIBER PO, Take 2 tablets by mouth daily., Disp: , Rfl:  .  Multiple Vitamins-Minerals (MULTIVITAMIN WITH MINERALS) tablet, Take 1 tablet by mouth daily., Disp: , Rfl:  .  PORTIA-28 0.15-30  MG-MCG tablet, Take 1 tablet by mouth daily., Disp: , Rfl: 4 .  acetaminophen (TYLENOL) 325 MG tablet, Take 650 mg by mouth every 6 (six) hours as needed. Reported on 02/04/2016, Disp: , Rfl:  .  Calcium-Magnesium-Vitamin D (CALCIUM 500 PO), Take 2 tablets by mouth., Disp: , Rfl:   Allergies  Allergen Reactions  . Penicillins Rash and Hives  . Lactose   . Shellfish Allergy      Review of Systems Constitutional: -fever, -chills, -sweats, -unexpected weight change, -decreased appetite, -fatigue Allergy: -sneezing, -itching, -congestion Dermatology: -changing moles, --rash, -lumps ENT: -runny nose, -ear pain, -sore throat, -hoarseness, -sinus pain, -teeth pain, - ringing in ears, -hearing loss, -nosebleeds Cardiology: -chest pain, -palpitations, -swelling, -difficulty breathing when lying flat, -waking up short of breath Respiratory: -cough, -shortness of breath, -difficulty breathing with exercise or exertion, -wheezing, -coughing up blood Gastroenterology: -abdominal pain, -nausea, -vomiting, -diarrhea, -constipation, -blood in stool, -changes in bowel movement, -difficulty swallowing or eating Hematology: -bleeding, -bruising  Musculoskeletal: -joint aches, -muscle aches, -joint swelling, -back pain, -neck pain, -cramping, -changes in gait Ophthalmology: denies vision changes, eye redness, itching, discharge Urology: -burning with urination, -difficulty urinating, -blood in urine, -urinary frequency, -urgency, -incontinence Neurology: -headache, -weakness, -tingling, -numbness, -memory loss, -falls, -dizziness Psychology: -depressed mood, -agitation, -sleep problems Breast/gyn: -breast tenderness, -discharge, -lumps, -vaginal discharge,- irregular periods, -heavy periods     Objective:  BP 90/62   Pulse 64   Temp 98.1 F (36.7 C) (Oral)   Ht 5' (1.524 m)   Wt 109 lb (49.4 kg)   SpO2 98%   BMI 21.29 kg/m   General appearance: alert, no distress, WD/WN, Caucasian female Skin: no  worrisome lesions HEENT: normocephalic, conjunctiva/corneas normal, sclerae anicteric, PERRLA, EOMi, nares patent, no discharge or erythema, pharynx normal Oral cavity: MMM, tongue normal, teeth normal Neck: supple, no lymphadenopathy, no thyromegaly, no masses, normal ROM, no bruits Chest: non tender, normal shape and expansion Heart: RRR, normal S1, S2, no murmurs Lungs: CTA bilaterally, no wheezes, rhonchi, or rales Abdomen: +bs, soft, non tender, non distended, no masses, no hepatomegaly, no splenomegaly, no bruits Back: non tender, normal ROM, no scoliosis Musculoskeletal: upper extremities non tender, no obvious deformity, normal ROM throughout, lower extremities non tender, no obvious deformity, normal ROM throughout Extremities: no edema, no cyanosis, no clubbing Pulses: 2+ symmetric, upper and lower extremities, normal cap refill Neurological: alert, oriented x  3, CN2-12 intact, strength normal upper extremities and lower extremities, sensation normal throughout, DTRs 2+ throughout, no cerebellar signs, gait normal Psychiatric: normal affect, behavior normal, pleasant  Breast/gyn/rectal - deferred to gynecology     Assessment and Plan :   Encounter Diagnoses  Name Primary?  . Encounter for health maintenance examination in adult Yes  . Need for HPV vaccination   . Genital herpes simplex, unspecified site   . Family history of hyperlipidemia     Physical exam - discussed and counseled on healthy lifestyle, diet, exercise, preventative care, vaccinations, sick and well care, proper use of emergency dept and after hours care, and addressed their concerns.    Health screening: Advised they see their eye doctor yearly for routine vision care. Advised they see their dentist yearly for routine dental care including hygiene visits twice yearly.  Cancer screening Counseled on self breast exams, mammograms, cervical cancer screening  Vaccinations: Advised yearly influenza  vaccine She will get free at work  Up to date on Tdap  Counseled on the Human Papilloma virus vaccine.  Vaccine information sheet given.  HPV vaccine given after consent obtained.  Patient was advised to return in 2 months for HPV #2, and in 6 months for HPV #3.     Separate significant chronic issues discussed: HSV - discussed acute therapy with valtrex   Mella was seen today for annual exam.  Diagnoses and all orders for this visit:  Encounter for health maintenance examination in adult -     Lipid panel -     CBC with Differential/Platelet -     Comprehensive metabolic panel  Need for HPV vaccination -     HPV 9-valent vaccine,Recombinat  Genital herpes simplex, unspecified site  Family history of hyperlipidemia    Follow-up pending labs, yearly for physical

## 2019-08-25 NOTE — Telephone Encounter (Signed)
Labs are being drawn.

## 2019-08-26 LAB — CBC WITH DIFFERENTIAL/PLATELET
Basophils Absolute: 0 10*3/uL (ref 0.0–0.2)
Basos: 1 %
EOS (ABSOLUTE): 0.2 10*3/uL (ref 0.0–0.4)
Eos: 4 %
Hematocrit: 40.3 % (ref 34.0–46.6)
Hemoglobin: 13.1 g/dL (ref 11.1–15.9)
Immature Grans (Abs): 0 10*3/uL (ref 0.0–0.1)
Immature Granulocytes: 0 %
Lymphocytes Absolute: 1.9 10*3/uL (ref 0.7–3.1)
Lymphs: 35 %
MCH: 31.6 pg (ref 26.6–33.0)
MCHC: 32.5 g/dL (ref 31.5–35.7)
MCV: 97 fL (ref 79–97)
Monocytes Absolute: 0.3 10*3/uL (ref 0.1–0.9)
Monocytes: 6 %
Neutrophils Absolute: 2.9 10*3/uL (ref 1.4–7.0)
Neutrophils: 54 %
Platelets: 257 10*3/uL (ref 150–450)
RBC: 4.14 x10E6/uL (ref 3.77–5.28)
RDW: 11.5 % — ABNORMAL LOW (ref 11.7–15.4)
WBC: 5.3 10*3/uL (ref 3.4–10.8)

## 2019-08-26 LAB — COMPREHENSIVE METABOLIC PANEL
ALT: 13 IU/L (ref 0–32)
AST: 23 IU/L (ref 0–40)
Albumin/Globulin Ratio: 1.9 (ref 1.2–2.2)
Albumin: 4 g/dL (ref 3.9–5.0)
Alkaline Phosphatase: 45 IU/L (ref 39–117)
BUN/Creatinine Ratio: 8 — ABNORMAL LOW (ref 9–23)
BUN: 8 mg/dL (ref 6–20)
Bilirubin Total: 0.8 mg/dL (ref 0.0–1.2)
CO2: 20 mmol/L (ref 20–29)
Calcium: 9.1 mg/dL (ref 8.7–10.2)
Chloride: 108 mmol/L — ABNORMAL HIGH (ref 96–106)
Creatinine, Ser: 0.95 mg/dL (ref 0.57–1.00)
GFR calc Af Amer: 96 mL/min/{1.73_m2} (ref >59)
GFR calc non Af Amer: 84 mL/min/{1.73_m2} (ref >59)
Globulin, Total: 2.1 g/dL (ref 1.5–4.5)
Glucose: 78 mg/dL (ref 65–99)
Potassium: 4.3 mmol/L (ref 3.5–5.2)
Sodium: 141 mmol/L (ref 134–144)
Total Protein: 6.1 g/dL (ref 6.0–8.5)

## 2019-08-26 LAB — LIPID PANEL
Chol/HDL Ratio: 2.8 ratio (ref 0.0–4.4)
Cholesterol, Total: 142 mg/dL (ref 100–199)
HDL: 51 mg/dL (ref >39)
LDL Chol Calc (NIH): 76 mg/dL (ref 0–99)
Triglycerides: 77 mg/dL (ref 0–149)
VLDL Cholesterol Cal: 15 mg/dL (ref 5–40)

## 2019-10-24 ENCOUNTER — Other Ambulatory Visit: Payer: 59

## 2019-10-26 ENCOUNTER — Other Ambulatory Visit (INDEPENDENT_AMBULATORY_CARE_PROVIDER_SITE_OTHER): Payer: 59

## 2019-10-26 ENCOUNTER — Other Ambulatory Visit: Payer: Self-pay

## 2019-10-26 DIAGNOSIS — Z23 Encounter for immunization: Secondary | ICD-10-CM | POA: Diagnosis not present

## 2019-11-02 ENCOUNTER — Other Ambulatory Visit: Payer: Self-pay

## 2019-11-02 DIAGNOSIS — Z20822 Contact with and (suspected) exposure to covid-19: Secondary | ICD-10-CM

## 2019-11-04 LAB — NOVEL CORONAVIRUS, NAA: SARS-CoV-2, NAA: DETECTED — AB

## 2020-02-22 ENCOUNTER — Other Ambulatory Visit: Payer: Self-pay

## 2020-02-22 ENCOUNTER — Other Ambulatory Visit (INDEPENDENT_AMBULATORY_CARE_PROVIDER_SITE_OTHER): Payer: No Typology Code available for payment source

## 2020-02-22 DIAGNOSIS — Z23 Encounter for immunization: Secondary | ICD-10-CM | POA: Diagnosis not present

## 2020-10-03 ENCOUNTER — Other Ambulatory Visit: Payer: Self-pay

## 2020-10-03 ENCOUNTER — Encounter: Payer: Self-pay | Admitting: Medical

## 2020-10-03 ENCOUNTER — Ambulatory Visit (INDEPENDENT_AMBULATORY_CARE_PROVIDER_SITE_OTHER): Payer: No Typology Code available for payment source | Admitting: Medical

## 2020-10-03 VITALS — BP 114/62 | HR 70 | Ht 60.0 in | Wt 106.8 lb

## 2020-10-03 DIAGNOSIS — Z0000 Encounter for general adult medical examination without abnormal findings: Secondary | ICD-10-CM | POA: Diagnosis not present

## 2020-10-03 DIAGNOSIS — Z1329 Encounter for screening for other suspected endocrine disorder: Secondary | ICD-10-CM | POA: Diagnosis not present

## 2020-10-03 DIAGNOSIS — Z13220 Encounter for screening for lipoid disorders: Secondary | ICD-10-CM | POA: Insufficient documentation

## 2020-10-03 DIAGNOSIS — Z23 Encounter for immunization: Secondary | ICD-10-CM

## 2020-10-03 DIAGNOSIS — G479 Sleep disorder, unspecified: Secondary | ICD-10-CM

## 2020-10-03 LAB — POCT URINALYSIS DIP (PROADVANTAGE DEVICE)
Bilirubin, UA: NEGATIVE
Glucose, UA: NEGATIVE mg/dL
Ketones, POC UA: NEGATIVE mg/dL
Leukocytes, UA: NEGATIVE
Nitrite, UA: NEGATIVE
Protein Ur, POC: NEGATIVE mg/dL
Specific Gravity, Urine: 1.015
Urobilinogen, Ur: 0.2
pH, UA: 7 (ref 5.0–8.0)

## 2020-10-03 LAB — CBC WITH DIFFERENTIAL/PLATELET
Eos: 4 %
Immature Grans (Abs): 0 10*3/uL (ref 0.0–0.1)
Lymphs: 30 %
Neutrophils: 60 %

## 2020-10-03 LAB — LIPID PANEL

## 2020-10-03 NOTE — Progress Notes (Signed)
Subjective:   HPI  Kelly Dean is a 24 y.o. female who presents for Chief Complaint  Patient presents with  . Annual Exam    with fasting labs     Patient Care Team: Sharyl Panchal, Kermit Balo, PA-C as PCP - General (Family Medicine) Sees dentist:Dr. Gita Kudo eye doctor:n/a [facility]  Concerns: trouble sleeping recently.   Gynecological history:Dr. Renaldo Fiddler  0 pregnancies Periods are regular but has breakthrough bleeding on birth control pills. patient sees gynecology for gynecology care.  Has trouble sleeping.  Has tried limiting caffine.   This is an every day problem.  Has tried melatonin.  Unisom helps some.  Gets to sleep initially sometimes, but often wakes back up.  Tries to get in bed same time daily.  Typical bed time 9:30am.  Working 3-4 days per week, 12 hour shifts.   Mind is constantly going.  Has thoughts about relationship concerns, concerns about lease coming up for annual soon, whether she wants to move in with her boyfriend of 1 year, career.  Reviewed their medical, surgical, family, social, medication, and allergy history and updated chart as appropriate.  Past Medical History:  Diagnosis Date  . Allergy   . Asthma 05/2015   exercise induced, mild intermittent  . Contraception management    [facility], [facility]  . Eczema   . Encounter for routine gynecological examination    sees Dr. Renaldo Fiddler, [facility]    Family History  Problem Relation Age of Onset  . Hypertension Father   . Hyperlipidemia Father   . Migraines Mother   . Hyperlipidemia Mother   . Arthritis Paternal Grandmother   . Asthma Paternal Grandmother   . Hyperlipidemia Paternal Grandmother   . Hypertension Paternal Grandmother   . Obesity Paternal Grandmother   . Heart disease Paternal Grandfather 43  . Hyperlipidemia Paternal Grandfather   . Hypertension Paternal Grandfather   . Cancer Paternal Aunt        breast  . Cancer Paternal Uncle         prostate  . Heart disease Maternal Grandmother        surgery for PDA age 28, and aortic valve replacement at 58  . Cancer Maternal Grandmother        liposarcoma  . Hearing loss Maternal Grandmother   . Stroke Neg Hx   . Diabetes Neg Hx      Current Outpatient Medications:  .  FIBER PO, Take 2 tablets by mouth daily., Disp: , Rfl:  .  levonorgestrel-ethinyl estradiol (NORDETTE) 0.15-30 MG-MCG tablet, Take 1 tablet by mouth daily., Disp: , Rfl:  .  Multiple Vitamins-Minerals (MULTIVITAMIN WITH MINERALS) tablet, Take 1 tablet by mouth daily., Disp: , Rfl:   Allergies  Allergen Reactions  . Penicillins Rash and Hives  . Lactose      Review of Systems Constitutional: -fever, -chills, -sweats, -unexpected weight change, -decreased appetite, -fatigue Allergy: -sneezing, -itching, -congestion Dermatology: -changing moles, --rash, -lumps ENT: -runny nose, -ear pain, -sore throat, -hoarseness, -sinus pain, -teeth pain, - ringing in ears, -hearing loss, -nosebleeds Cardiology: -chest pain, -palpitations, -swelling, -difficulty breathing when lying flat, -waking up short of breath Respiratory: -cough, -shortness of breath, -difficulty breathing with exercise or exertion, -wheezing, -coughing up blood Gastroenterology: -abdominal pain, -nausea, -vomiting, -diarrhea, -constipation, -blood in stool, -changes in bowel movement, -difficulty swallowing or eating Hematology: -bleeding, -bruising  Musculoskeletal: -joint aches, -muscle aches, -joint swelling, -back pain, -neck pain, -cramping, -changes in gait Ophthalmology:  denies vision changes, eye redness, itching, discharge Urology: -burning with urination, -difficulty urinating, -blood in urine, -urinary frequency, -urgency, -incontinence Neurology: -headache, -weakness, -tingling, -numbness, -memory loss, -falls, -dizziness Psychology: -depressed mood, -agitation, +sleep problems Breast/gyn: -breast tendnerss, -discharge, -lumps, -vaginal  discharge,- irregular periods, -heavy periods     Objective:  BP 114/62   Pulse 70   Ht 5' (1.524 m)   Wt 106 lb 12.8 oz (48.4 kg)   SpO2 99%   BMI 20.86 kg/m   General appearance: alert, no distress, WD/WN, Caucasian female Skin: unremarkable HEENT: normocephalic, conjunctiva/corneas normal, sclerae anicteric, PERRLA, EOMi, nares patent, no discharge or erythema, pharynx normal Oral cavity: MMM, tongue normal, teeth normal Neck: supple, no lymphadenopathy, no thyromegaly, no masses, normal ROM, no bruits Chest: non tender, normal shape and expansion Heart: RRR, normal S1, S2, no murmurs Lungs: CTA bilaterally, no wheezes, rhonchi, or rales Abdomen: +bs, soft, non tender, non distended, no masses, no hepatomegaly, no splenomegaly, no bruits Back: non tender, normal ROM, no scoliosis Musculoskeletal: upper extremities non tender, no obvious deformity, normal ROM throughout, lower extremities non tender, no obvious deformity, normal ROM throughout Extremities: no edema, no cyanosis, no clubbing Pulses: 2+ symmetric, upper and lower extremities, normal cap refill Neurological: alert, oriented x 3, CN2-12 intact, strength normal upper extremities and lower extremities, sensation normal throughout, DTRs 2+ throughout, no cerebellar signs, gait normal Psychiatric: normal affect, behavior normal, pleasant  Breast/gyn/rectal - deferred to gynecology   Assessment and Plan :   Encounter Diagnoses  Name Primary?  . Encounter for health maintenance examination in adult Yes  . Screening for lipid disorders   . Screening for thyroid disorder   . Need for influenza vaccination   . Sleep disturbance     Physical exam - discussed and counseled on healthy lifestyle, diet, exercise, preventative care, vaccinations, sick and well care, proper use of emergency dept and after hours care, and addressed their concerns.    Health screening: Advised they see their eye doctor yearly for routine  vision care. Advised they see their dentist yearly for routine dental care including hygiene visits twice yearly. See your gynecologist yearly for routine gynecological care.   Cancer screening Counseled on self breast exams, mammograms, cervical cancer screening  We will request recent pap info from gynecology   Vaccinations: Advised yearly influenza vaccine Counseled on the influenza virus vaccine.  Vaccine information sheet given.  Influenza vaccine given after consent obtained.  She is up to date on HPV, Td, covid vaccines    Separate significant issues discussed: Sleep disturbance -counseled on sleep hygiene, advise she seek counseling in general regarding relationship concerns, life events, life stressors, as she could use some guidance on several things in life right now.  Advised against any heavy duty sleep aids other than Benadryl occasionally.    Alyanna was seen today for annual exam.  Diagnoses and all orders for this visit:  Encounter for health maintenance examination in adult -     Comprehensive metabolic panel -     CBC with Differential/Platelet -     Lipid panel -     TSH -     POCT Urinalysis DIP (Proadvantage Device)  Screening for lipid disorders -     Lipid panel  Screening for thyroid disorder -     TSH  Need for influenza vaccination  Sleep disturbance  Other orders -     Flu Vaccine QUAD 6+ mos PF IM (Fluarix Quad PF)   Follow-up pending labs,  yearly for physical

## 2020-10-03 NOTE — Patient Instructions (Signed)
Recommendations:  Check out Salley Slaughter webiste and podcast on financial planning  Pursue counseling in general   Insomnia Insomnia is a sleep disorder that makes it difficult to fall asleep or stay asleep. Insomnia can cause fatigue, low energy, difficulty concentrating, mood swings, and poor performance at work or school. There are three different ways to classify insomnia:  Difficulty falling asleep.  Difficulty staying asleep.  Waking up too early in the morning. Any type of insomnia can be long-term (chronic) or short-term (acute). Both are common. Short-term insomnia usually lasts for three months or less. Chronic insomnia occurs at least three times a week for longer than three months. What are the causes? Insomnia may be caused by another condition, situation, or substance, such as:  Anxiety.  Certain medicines.  Gastroesophageal reflux disease (GERD) or other gastrointestinal conditions.  Asthma or other breathing conditions.  Restless legs syndrome, sleep apnea, or other sleep disorders.  Chronic pain.  Menopause.  Stroke.  Abuse of alcohol, tobacco, or illegal drugs.  Mental health conditions, such as depression.  Caffeine.  Neurological disorders, such as Alzheimer's disease.  An overactive thyroid (hyperthyroidism). Sometimes, the cause of insomnia may not be known. What increases the risk? Risk factors for insomnia include:  Gender. Women are affected more often than men.  Age. Insomnia is more common as you get older.  Stress.  Lack of exercise.  Irregular work schedule or working night shifts.  Traveling between different time zones.  Certain medical and mental health conditions. What are the signs or symptoms? If you have insomnia, the main symptom is having trouble falling asleep or having trouble staying asleep. This may lead to other symptoms, such as:  Feeling fatigued or having low energy.  Feeling nervous about going to  sleep.  Not feeling rested in the morning.  Having trouble concentrating.  Feeling irritable, anxious, or depressed. How is this diagnosed? This condition may be diagnosed based on:  Your symptoms and medical history. Your health care provider may ask about: ? Your sleep habits. ? Any medical conditions you have. ? Your mental health.  A physical exam. How is this treated? Treatment for insomnia depends on the cause. Treatment may focus on treating an underlying condition that is causing insomnia. Treatment may also include:  Medicines to help you sleep.  Counseling or therapy.  Lifestyle adjustments to help you sleep better. Follow these instructions at home: Eating and drinking   Limit or avoid alcohol, caffeinated beverages, and cigarettes, especially close to bedtime. These can disrupt your sleep.  Do not eat a large meal or eat spicy foods right before bedtime. This can lead to digestive discomfort that can make it hard for you to sleep. Sleep habits   Keep a sleep diary to help you and your health care provider figure out what could be causing your insomnia. Write down: ? When you sleep. ? When you wake up during the night. ? How well you sleep. ? How rested you feel the next day. ? Any side effects of medicines you are taking. ? What you eat and drink.  Make your bedroom a dark, comfortable place where it is easy to fall asleep. ? Put up shades or blackout curtains to block light from outside. ? Use a white noise machine to block noise. ? Keep the temperature cool.  Limit screen use before bedtime. This includes: ? Watching TV. ? Using your smartphone, tablet, or computer.  Stick to a routine that includes going to bed  and waking up at the same times every day and night. This can help you fall asleep faster. Consider making a quiet activity, such as reading, part of your nighttime routine.  Try to avoid taking naps during the day so that you sleep better at  night.  Get out of bed if you are still awake after 15 minutes of trying to sleep. Keep the lights down, but try reading or doing a quiet activity. When you feel sleepy, go back to bed. General instructions  Take over-the-counter and prescription medicines only as told by your health care provider.  Exercise regularly, as told by your health care provider. Avoid exercise starting several hours before bedtime.  Use relaxation techniques to manage stress. Ask your health care provider to suggest some techniques that may work well for you. These may include: ? Breathing exercises. ? Routines to release muscle tension. ? Visualizing peaceful scenes.  Make sure that you drive carefully. Avoid driving if you feel very sleepy.  Keep all follow-up visits as told by your health care provider. This is important. Contact a health care provider if:  You are tired throughout the day.  You have trouble in your daily routine due to sleepiness.  You continue to have sleep problems, or your sleep problems get worse. Get help right away if:  You have serious thoughts about hurting yourself or someone else. If you ever feel like you may hurt yourself or others, or have thoughts about taking your own life, get help right away. You can go to your nearest emergency department or call:  Your local emergency services (911 in the [place]  A suicide crisis helpline, such as the National Suicide Prevention Lifeline at 5753327234. This is open 24 hours a day. Summary  Insomnia is a sleep disorder that makes it difficult to fall asleep or stay asleep.  Insomnia can be long-term (chronic) or short-term (acute).  Treatment for insomnia depends on the cause. Treatment may focus on treating an underlying condition that is causing insomnia.  Keep a sleep diary to help you and your health care provider figure out what could be causing your insomnia. This information is not intended to replace advice given  to you by your health care provider. Make sure you discuss any questions you have with your health care provider. Document Revised: 11/13/2017 Document Reviewed: 09/10/2017 Elsevier Patient Education  2020 [employer].    Preventative Care for Adults - Female   Thank you for coming in for your well visit today, and thank you for trusting [place] with your care!   Maintain regular health and wellness exams:  A routine yearly physical is a good way to check in with your primary care provider about your health and preventive screening. It is also an opportunity to share updates about your health and any concerns you have, and receive a thorough all-over exam.   Most health insurance companies pay for at least some preventative services.  Check with your health plan for specific coverages.  What preventative services do women need?  Adult women should have their weight and blood pressure checked regularly.   Women age 27 and older should have their cholesterol levels checked regularly.  Women should be screened for cervical cancer with a Pap smear and pelvic exam beginning at either age 57, or 3 years after they become sexually activity.    Breast cancer screening generally begins at age 82 with a mammogram and breast exam by your primary care provider.  Beginning at age 57 and continuing to age 76, women should be screened for colorectal cancer.  Certain people may need continued testing until age 72.  Updating vaccinations is part of preventative care.  Vaccinations help protect against diseases such as the flu.  Osteoporosis is a disease in which the bones lose minerals and strength as we age. Women ages 79 and over should discuss this with their caregivers, as should women after menopause who have other risk factors.  Lab tests are generally done as part of preventative care to screen for anemia and blood disorders, to screen for problems with the kidneys and liver, to screen for  bladder problems, to check blood sugar, and to check your cholesterol level.  Preventative services generally include counseling about diet, exercise, avoiding tobacco, drugs, excessive alcohol consumption, and sexually transmitted infections.    Xrays and CT scans are not normally done as a preventative test, and most insurances do not pay for imaging for screening other than as discussed under cancer screens below.   On the other hand, if you have certain medical concerns, imaging may be necessary as a diagnostic test.   Your Medical Team Your medical team starts with [place], your PCP or primary care provider.  Please use our services for your routine care such as physicals, screenings, immunizations, sick visits, and your first stop for general medical concerns.  You can call our number for after hours information for urgent questions that may need attention but cannot wait til the next business day.    Urgent care-urgent cares exist to provide care when your primary care office would typically be closed such as evenings or weekends.   Urgent care is for evaluation of urgent medical problems that do not necessarily require emergency department care, but cannot wait til the next business day when we are open.  Emergency department care-please reserve emergency department care for serious, urgent, possibly life-threatening medical problems.  This includes issues like possible stroke, heart attack, significant injury, mental health crisis, or other urgent need that requires immediate medical attention.     See your dentist office twice yearly for hygiene and cleaning visits.   Brush your teeth and floss your teeth daily.  See your eye doctor yearly for routine eye exam and screenings for glaucoma and retinal disease.  See your gynecologist yearly if you do not have your female/gynecological exams at our office.     Vaccines:  Stay up to date with your tetanus shots and other required  immunizations. You should have a booster for tetanus every 10 years. Be sure to get your flu shot every year, since 5%-20% of the [place] population comes down with the flu. The flu vaccine changes each year, so being vaccinated once is not enough. Get your shot in the fall, before the flu season peaks.   Other vaccines to consider:  Pneumococcal vaccine to protect against certain types of pneumonia.  This is normally recommended for adults age 3 or older.  However, adults younger than 26 years old with certain underlying conditions such as diabetes, heart or lung disease should also receive the vaccine.  Shingles vaccine to protect against Varicella Zoster if you are older than age 48, or younger than 26 years old with certain underlying illness.  If you have not had the Shingrix vaccine, please call your insurer to inquire about coverage for the Shingrix vaccine given in 2 doses.   Some insurers cover this vaccine after age 80, some cover this  after age 35.  If your insurer covers this, then call to schedule appointment to have this vaccine here  Hepatitis A vaccine to protect against a form of infection of the liver by a virus acquired from food.  Hepatitis B vaccine to protect against a form of infection of the liver by a virus acquired from blood or body fluids, particularly if you work in health care.  If you plan to travel internationally, check with your local health department for specific vaccination recommendations.  Human Papilloma Virus or HPV causes cancer of the cervix, and other infections that can be transmitted from person to person. There is a vaccine for HPV, and males should get immunized between the ages of 62 and 50. It requires a series of 3 shots.   Covid/Coronavirus - Please consider vaccination for your benefit and to help prevent spread of Covid to those around you.     What should I know about Cancer screening? Many types of cancers can be detected early and may often be  prevented. Lung Cancer  You should be screened every year for lung cancer if: ? You are a current smoker who has smoked for at least 30 years. ? You are a former smoker who has quit within the past 15 years.  Talk to your health care provider about your screening options, when you should start screening, and how often you should be screened.  Breast cancer screening is essential to preventive care for women. All women age 41 and older should perform a breast self-exam every month. At age 81 and older, women should have their caregiver complete a breast exam each year. Women at ages 62 and older should have a mammogram (x-ray film) of the breasts. Your caregiver can discuss how often you need mammograms.    Breast cancer screening   [facility]   Or    [facility]   [phone]         [id number]       [address] [place], [place] [address]        [place], [place] [address]  Cervical cancer screening includes taking a Pap smear (sample of cells examined under a microscope) from the cervix (end of the uterus). It also includes testing for HPV (Human Papilloma Virus, which can cause cervical cancer). Screening and a pelvic exam should begin at age 88, or 3 years after a woman becomes sexually active. Screening should occur every year, with a Pap smear but no HPV testing, up to age 5. After age 18, you should have a Pap smear every 3 years with HPV testing, if no HPV was found previously.   Colorectal Cancer  Routine colorectal cancer screening usually begins at 26 years of age and should be repeated every 5-10 years until you are 26 years old. You may need to be screened more often if early forms of precancerous polyps or small growths are found. Your health care provider may recommend screening at an earlier age if you have risk factors for colon cancer.  Your health care provider may recommend using home test kits to  check for hidden blood in the stool.  A small camera at the end of a tube can be used to examine your colon (sigmoidoscopy or colonoscopy). This checks for the earliest forms of colorectal cancer.  Skin Cancer  Check your skin from head to toe regularly.  Tell your health care provider about  any new moles or changes in moles, especially if: ? There is a change in a mole's size, shape, or color. ? You have a mole that is larger than a pencil eraser.  Always use sunscreen. Apply sunscreen liberally and repeat throughout the day.  Protect yourself by wearing long sleeves, pants, a wide-brimmed hat, and sunglasses when outside.   GENERAL RECOMMENDATIONS FOR GOOD HEALTH:  Healthy diet:  Eat a variety of foods, including fruit, vegetables, animal or vegetable protein, such as meat, fish, chicken, and eggs, or beans, lentils, tofu, and grains, such as rice.  Drink plenty of water daily.  Decrease saturated fat in the diet, avoid lots of red meat, processed foods, sweets, fast foods, and fried foods.  Exercise:  Aerobic exercise helps maintain good heart health. At least 30-40 minutes of moderate-intensity exercise is recommended. For example, a brisk walk that increases your heart rate and breathing. This should be done on most days of the week.   Find a type of exercise or a variety of exercises that you enjoy so that it becomes a part of your daily life.  Examples are running, walking, swimming, water aerobics, and biking.  For motivation and support, explore group exercise such as aerobic class, spin class, Zumba, Yoga,or  martial arts, etc.    Set exercise goals for yourself, such as a certain weight goal, walk or run in a race such as a 5k walk/run.  Speak to your primary care provider about exercise goals.  Your weight readings per our records: Wt Readings from Last 3 Encounters:  10/03/20 106 lb 12.8 oz (48.4 kg)  08/25/19 109 lb (49.4 kg)  04/01/19 108 lb (49 kg)       Disease prevention:  If you smoke or chew tobacco, find out from your caregiver how to quit. It can literally save your life, no matter how long you have been a tobacco user. If you do not use tobacco, never begin.   Maintain a healthy diet and normal weight. Increased weight leads to problems with blood pressure and diabetes.   The Body Mass Index or BMI is a way of measuring how much of your body is fat. Having a BMI above 27 increases the risk of heart disease, diabetes, hypertension, stroke and other problems related to obesity. Your caregiver can help determine your BMI and based on it develop an exercise and dietary program to help you achieve or maintain this important measurement at a healthful level.  High blood pressure causes heart and blood vessel problems.  Persistent high blood pressure should be treated with medicine if weight loss and exercise do not work.  Your blood pressure readings per our records:     BP Readings from Last 3 Encounters:  10/03/20 114/62  08/25/19 90/62  08/23/18 100/60     Fat and cholesterol leaves deposits in your arteries that can block them. This causes heart disease and vessel disease elsewhere in your body.  If your cholesterol is found to be high, or if you have heart disease or certain other medical conditions, then you may need to have your cholesterol monitored frequently and be treated with medication.   Ask if you should have a cardiac stress test if your history suggests this. A stress test is a test done on a treadmill that looks for heart disease. This test can find disease prior to there being a problem.    Menopause can be associated with physical symptoms and risks. Hormone replacement therapy is  available to decrease these. You should talk to your caregiver about whether starting or continuing to take hormones is right for you.   Osteoporosis is a disease in which the bones lose minerals and strength as we age. This can result  in serious bone fractures. Risk of osteoporosis can be identified using a bone density scan. Women ages 7865 and over should discuss this with their caregivers, as should women after menopause who have other risk factors. Ask your caregiver whether you should be taking a calcium supplement and Vitamin D, to reduce the rate of osteoporosis.   Osteoporosis screening/bone density testing:  [facility]   Or    [facility]   [phone]          [phone] [id number]       [address] [place], [place] [id number]        [place] [id number]    Avoid drinking alcohol in excess (more than two drinks per day).  Avoid use of street drugs. Do not share needles with anyone. Ask for professional help if you need assistance or instructions on stopping the use of alcohol, cigarettes, and/or drugs.  Brush your teeth twice a day with fluoride toothpaste, and floss once a day. Good oral hygiene prevents tooth decay and gum disease. The problems can be painful, unattractive, and can cause other health problems. Visit your dentist for a routine oral and dental check up and preventive care every 6-12 months.    Spiritual and Emotional Health Keeping a healthy spiritual life can help you better manage your physical health. Your spiritual life can help you to cope with any issues that may arise with your physical health.  Balance can keep us healthy and help us to recover.  If you are struggling with your spiritual health there are questions that you may want to ask yourself:  What makes me feel most complete? When do I feel most connected to the rest of the world? Where do I find the most inner strength? What am I doing when I feel whole?  Helpful tips: . Being in nature. Some people feel very connected and at peace when they are walking outdoors or are outside. Marland Kitchen. Helping others. Some feel the largest sense of wellbeing when they are of  service to others. Being of service can take on many forms. It can be doing volunteer work, being kind to strangers, or offering a hand to a friend in need. . Gratitude. Some people find they feel the most connected when they remain grateful. They may make lists of all the things they are grateful for or say a thank you out loud for all they have.    Emotional Health Are you in tune with your emotional health?  Check out this link: http://www.marquez-love.com/Https://www.apa.org/topics/emotions    Legal  Take the time to do a last will and testament, Advanced Directives including Health Care Power of Attorney and Living Will documents.  Don't leave your family with burdens that can be handled ahead of time.   Financial Health . Make sure you use a budget for your personal finances . Make sure you are insured against risks (health insurance, life insurance, auto insurance, etc) . Save more, spend less . Set financial goals . If you need help in this area, good resources include counseling through [employer] or other community resources, have a meeting with a Social research officer, governmentcertified financial planner, and a good resource is  Salley Slaughter podcast    Top 10 reasons people come to the doctor's office:   (what is your "ounce of prevention")  Skin disorders; Osteoarthritis and joint disorders; Back problems; Cholesterol problems; Upper respiratory conditions, excluding asthma; Anxiety, depression, and bipolar disorder; Chronic neurologic disorders; High blood pressure; Headaches and migraines; and Diabetes.    Safety:  Use seatbelts 100% of the time, whether driving or as a passenger.  Use safety devices such as hearing protection if you work in environments with loud noise or significant background noise.  Use safety glasses when doing any work that could send debris in to the eyes.  Use a helmet if you ride a bike or motorcycle.  Use appropriate safety gear for contact sports.  Talk to your caregiver about gun  safety.  Use sunscreen with a SPF (or skin protection factor) of 15 or greater.  Lighter skinned people are at a greater risk of skin cancer. Don't forget to also wear sunglasses in order to protect your eyes from too much damaging sunlight. Damaging sunlight can accelerate cataract formation.   Keep carbon monoxide and smoke detectors in your home functioning at all times. Change the batteries every 6 months or use a model that plugs into the wall.    Sexual activity: . Sex is a normal part of life and sexual activity can continue into older adulthood for many healthy people.   . If you are having issues related to sexual activity, please follow up to discuss this further.   . If you are not in a monogamous relationship or have more than one partner, please practice safe sex.  Use condoms. Condoms are used for birth control and to help reduce the spread of sexually transmitted infections (or STIs).  Some of the STIs are gonorrhea (the clap), chlamydia, syphilis, trichomonas, herpes, HPV (human papilloma virus) and HIV (human immunodeficiency virus) which causes AIDS. The herpes, HIV and HPV are viral illnesses that have no cure. These can result in disability, cancer and death.   We are able to test for STIs here at our office.

## 2020-10-04 LAB — CBC WITH DIFFERENTIAL/PLATELET
Basophils Absolute: 0 10*3/uL (ref 0.0–0.2)
Basos: 1 %
EOS (ABSOLUTE): 0.2 10*3/uL (ref 0.0–0.4)
Hematocrit: 41.3 % (ref 34.0–46.6)
Hemoglobin: 13.7 g/dL (ref 11.1–15.9)
Immature Granulocytes: 0 %
Lymphocytes Absolute: 1.6 10*3/uL (ref 0.7–3.1)
MCH: 31.7 pg (ref 26.6–33.0)
MCHC: 33.2 g/dL (ref 31.5–35.7)
MCV: 96 fL (ref 79–97)
Monocytes Absolute: 0.3 10*3/uL (ref 0.1–0.9)
Monocytes: 5 %
Neutrophils Absolute: 3.2 10*3/uL (ref 1.4–7.0)
Platelets: 256 10*3/uL (ref 150–450)
RBC: 4.32 x10E6/uL (ref 3.77–5.28)
RDW: 11.7 % (ref 11.7–15.4)
WBC: 5.3 10*3/uL (ref 3.4–10.8)

## 2020-10-04 LAB — COMPREHENSIVE METABOLIC PANEL
ALT: 13 IU/L (ref 0–32)
AST: 21 IU/L (ref 0–40)
Albumin/Globulin Ratio: 1.9 (ref 1.2–2.2)
Albumin: 4.1 g/dL (ref 3.9–5.0)
Alkaline Phosphatase: 53 IU/L (ref 44–121)
BUN/Creatinine Ratio: 15 (ref 9–23)
BUN: 13 mg/dL (ref 6–20)
Bilirubin Total: 0.6 mg/dL (ref 0.0–1.2)
CO2: 21 mmol/L (ref 20–29)
Calcium: 9.1 mg/dL (ref 8.7–10.2)
Chloride: 105 mmol/L (ref 96–106)
Creatinine, Ser: 0.85 mg/dL (ref 0.57–1.00)
GFR calc Af Amer: 109 mL/min/{1.73_m2} (ref >59)
GFR calc non Af Amer: 95 mL/min/{1.73_m2} (ref >59)
Globulin, Total: 2.2 g/dL (ref 1.5–4.5)
Glucose: 82 mg/dL (ref 65–99)
Potassium: 4.4 mmol/L (ref 3.5–5.2)
Sodium: 140 mmol/L (ref 134–144)
Total Protein: 6.3 g/dL (ref 6.0–8.5)

## 2020-10-04 LAB — LIPID PANEL
Cholesterol, Total: 161 mg/dL (ref 100–199)
HDL: 56 mg/dL (ref >39)
LDL Chol Calc (NIH): 93 mg/dL (ref 0–99)
Triglycerides: 59 mg/dL (ref 0–149)
VLDL Cholesterol Cal: 12 mg/dL (ref 5–40)

## 2020-10-04 LAB — TSH: TSH: 0.813 u[IU]/mL (ref 0.450–4.500)

## 2020-10-17 ENCOUNTER — Ambulatory Visit (INDEPENDENT_AMBULATORY_CARE_PROVIDER_SITE_OTHER): Payer: No Typology Code available for payment source

## 2020-10-17 ENCOUNTER — Other Ambulatory Visit: Payer: Self-pay

## 2020-10-17 DIAGNOSIS — Z23 Encounter for immunization: Secondary | ICD-10-CM | POA: Diagnosis not present

## 2021-01-01 LAB — RESULTS CONSOLE HPV: CHL HPV: NEGATIVE

## 2021-01-01 LAB — HM PAP SMEAR: HM Pap smear: ABNORMAL

## 2021-04-19 ENCOUNTER — Encounter: Payer: Self-pay | Admitting: Family Medicine

## 2021-04-19 ENCOUNTER — Other Ambulatory Visit: Payer: Self-pay

## 2021-04-19 ENCOUNTER — Ambulatory Visit (INDEPENDENT_AMBULATORY_CARE_PROVIDER_SITE_OTHER): Payer: No Typology Code available for payment source | Admitting: Family Medicine

## 2021-04-19 VITALS — BP 124/72 | HR 86 | Temp 97.6°F | Ht 61.0 in | Wt 109.6 lb

## 2021-04-19 DIAGNOSIS — S76012A Strain of muscle, fascia and tendon of left hip, initial encounter: Secondary | ICD-10-CM

## 2021-04-19 NOTE — Patient Instructions (Addendum)
Use heat for 20 minutes 3 times per day.  Use the 800 mg of ibuprofen 3 times per day for the next 7 to 10 days.  When the pain goes away the start exercising again but started about 50% and go slow.  Work on Estate [employer]

## 2021-04-19 NOTE — Progress Notes (Signed)
   Subjective:    Patient ID: Kelly Dean, female    DOB: [birth year], 25 y.o.   MRN: [id number]  HPI She injured her back doing dead lifting approximately 1 week ago.  She complains of pain in the left upper gluteal area.  She has intermittently tried ibuprofen and Aleve with some relief of her symptoms.  She also has been alternating heat and ice.   Review of Systems     Objective:   Physical Exam Alert and in no distress.  Good motion of her back.  Questionable tenderness palpation over the left inferior iliac crest area.  No tenderness over SI joint.       Assessment & Plan:  Muscle strain of left gluteal region, initial encounter I explained that I thought she strained the gluteal muscle.  Recommend conservative care with heat for 20 minutes 3 times per day, ibuprofen 800 3 times daily.  And recommended slowly getting back into regular physical activities starting at least 50% of previous regimen.

## 2021-08-27 ENCOUNTER — Encounter: Payer: Self-pay | Admitting: Internal Medicine

## 2021-09-24 ENCOUNTER — Encounter: Payer: Self-pay | Admitting: Internal Medicine

## 2021-10-16 ENCOUNTER — Other Ambulatory Visit: Payer: Self-pay

## 2021-10-16 ENCOUNTER — Encounter: Payer: Self-pay | Admitting: Medical

## 2021-10-16 ENCOUNTER — Ambulatory Visit (INDEPENDENT_AMBULATORY_CARE_PROVIDER_SITE_OTHER): Payer: No Typology Code available for payment source | Admitting: Medical

## 2021-10-16 VITALS — BP 110/62 | HR 51 | Ht 61.0 in | Wt 106.6 lb

## 2021-10-16 DIAGNOSIS — Z7185 Encounter for immunization safety counseling: Secondary | ICD-10-CM

## 2021-10-16 DIAGNOSIS — Z23 Encounter for immunization: Secondary | ICD-10-CM

## 2021-10-16 DIAGNOSIS — Z0000 Encounter for general adult medical examination without abnormal findings: Secondary | ICD-10-CM

## 2021-10-16 DIAGNOSIS — Z13220 Encounter for screening for lipoid disorders: Secondary | ICD-10-CM | POA: Diagnosis not present

## 2021-10-16 DIAGNOSIS — R350 Frequency of micturition: Secondary | ICD-10-CM | POA: Diagnosis not present

## 2021-10-16 DIAGNOSIS — G479 Sleep disorder, unspecified: Secondary | ICD-10-CM

## 2021-10-16 DIAGNOSIS — Z83438 Family history of other disorder of lipoprotein metabolism and other lipidemia: Secondary | ICD-10-CM

## 2021-10-16 LAB — CBC
Hematocrit: 40.5 % (ref 34.0–46.6)
Hemoglobin: 13.8 g/dL (ref 11.1–15.9)
MCH: 32 pg (ref 26.6–33.0)
MCHC: 34.1 g/dL (ref 31.5–35.7)
MCV: 94 fL (ref 79–97)
Platelets: 271 10*3/uL (ref 150–450)
RBC: 4.31 x10E6/uL (ref 3.77–5.28)
RDW: 11.5 % — ABNORMAL LOW (ref 11.7–15.4)
WBC: 4.6 10*3/uL (ref 3.4–10.8)

## 2021-10-16 LAB — BASIC METABOLIC PANEL
BUN/Creatinine Ratio: 19 (ref 9–23)
BUN: 16 mg/dL (ref 6–20)
CO2: 20 mmol/L (ref 20–29)
Calcium: 9.2 mg/dL (ref 8.7–10.2)
Chloride: 102 mmol/L (ref 96–106)
Creatinine, Ser: 0.86 mg/dL (ref 0.57–1.00)
Glucose: 74 mg/dL (ref 70–99)
Potassium: 4.7 mmol/L (ref 3.5–5.2)
Sodium: 138 mmol/L (ref 134–144)
eGFR: 95 mL/min/{1.73_m2} (ref >59)

## 2021-10-16 LAB — URINALYSIS, ROUTINE W REFLEX MICROSCOPIC
Bilirubin, UA: NEGATIVE
Glucose, UA: NEGATIVE
Ketones, UA: NEGATIVE
Leukocytes,UA: NEGATIVE
Nitrite, UA: NEGATIVE
Protein,UA: NEGATIVE
RBC, UA: NEGATIVE
Specific Gravity, UA: 1.018 (ref 1.005–1.030)
Urobilinogen, Ur: 0.2 mg/dL (ref 0.2–1.0)
pH, UA: 6 (ref 5.0–7.5)

## 2021-10-16 LAB — LIPID PANEL
Chol/HDL Ratio: 2.9 ratio (ref 0.0–4.4)
Cholesterol, Total: 147 mg/dL (ref 100–199)
HDL: 51 mg/dL (ref >39)
LDL Chol Calc (NIH): 83 mg/dL (ref 0–99)
Triglycerides: 67 mg/dL (ref 0–149)
VLDL Cholesterol Cal: 13 mg/dL (ref 5–40)

## 2021-10-16 MED ORDER — TOLTERODINE TARTRATE ER 2 MG PO CP24: 2.0000 mg | ORAL_CAPSULE | Freq: Every day | ORAL | 1 refills | Status: DC

## 2021-10-16 NOTE — Progress Notes (Signed)
Subjective:   HPI  Kelly Dean is a 25 y.o. female who presents for Chief Complaint  Patient presents with   fasting cpe     Fasting cpe, urinating a lot. Sees obgyn- Dr. Julien Girt    Patient Care Team: Calli Bashor, Camelia Eng, PA-C as PCP - General (Family Medicine) Sees dentist:Dr. Joya Gaskins eye doctor [facility], Dr. Marylynn Pearson  Concerns:  Having lots of urination, definately noticeable at night.   Gets up 2-4 times per night to urinate.   Been having this issues 2 years. Sister has hx/o OAB.  No incontinence.   Not drinking.  Urinates throughout the day a lot as well, not small volumes.  Asthma - no problems in last few years.    Hx/o HSV, but no recent problems.    Reviewed their medical, surgical, family, social, medication, and allergy history and updated chart as appropriate.  Past Medical History:  Diagnosis Date   Allergy    Asthma 05/2015   exercise induced, mild intermittent   Contraception management    [facility], started [facility] 2016   Eczema    Encounter for routine gynecological examination    sees Dr. Julien Girt, [facility]    Family History  Problem Relation Age of Onset   Hypertension Father    Hyperlipidemia Father    Migraines Mother    Hyperlipidemia Mother    Arthritis Paternal Grandmother    Asthma Paternal Grandmother    Hyperlipidemia Paternal Grandmother    Hypertension Paternal Grandmother    Obesity Paternal Grandmother    Heart disease Paternal Grandfather 16   Hyperlipidemia Paternal Grandfather    Hypertension Paternal Grandfather    Cancer Paternal Aunt        breast   Cancer Paternal Uncle        prostate   Heart disease Maternal Grandmother        surgery for PDA age 28, and aortic valve replacement at 30   Cancer Maternal Grandmother        liposarcoma   Hearing loss Maternal Grandmother    Stroke Neg Hx    Diabetes Neg Hx      Current Outpatient Medications:    FIBER PO, Take 2 tablets by  mouth daily., Disp: , Rfl:    levonorgestrel-ethinyl estradiol (NORDETTE) 0.15-30 MG-MCG tablet, Take 1 tablet by mouth daily., Disp: , Rfl:    Multiple Vitamins-Minerals (MULTIVITAMIN WITH MINERALS) tablet, Take 1 tablet by mouth daily., Disp: , Rfl:    tolterodine (DETROL LA) 2 MG 24 hr capsule, Take 1 capsule (2 mg total) by mouth daily., Disp: 30 capsule, Rfl: 1   valACYclovir (VALTREX) 500 MG tablet, Take 500 mg by mouth 2 (two) times daily., Disp: , Rfl:   Allergies  Allergen Reactions   Penicillins Rash and Hives   Lactose      Review of Systems Constitutional: -fever, -chills, -sweats, -unexpected weight change, -decreased appetite, -fatigue Allergy: -sneezing, -itching, -congestion Dermatology: -changing moles, --rash, -lumps ENT: -runny nose, -ear pain, -sore throat, -hoarseness, -sinus pain, -teeth pain, - ringing in ears, -hearing loss, -nosebleeds Cardiology: -chest pain, -palpitations, -swelling, -difficulty breathing when lying flat, -waking up short of breath Respiratory: -cough, -shortness of breath, -difficulty breathing with exercise or exertion, -wheezing, -coughing up blood Gastroenterology: -abdominal pain, -nausea, -vomiting, -diarrhea, -constipation, -blood in stool, -changes in bowel movement, -difficulty swallowing or eating Hematology: -bleeding, -bruising  Musculoskeletal: -joint aches, -muscle aches, -joint swelling, -back pain, -neck pain, -cramping, -changes  in gait Ophthalmology: denies vision changes, eye redness, itching, discharge Urology: -burning with urination, -difficulty urinating, -blood in urine, +urinary frequency, -urgency, -incontinence Neurology: -headache, -weakness, -tingling, -numbness, -memory loss, -falls, -dizziness Psychology: -depressed mood, -agitation, +sleep problems Breast/gyn: -breast tendnerss, -discharge, -lumps, -vaginal discharge,- irregular periods, -heavy periods     Objective:  BP 110/62   Pulse (!) 51   Ht 5' 1"   (1.549 m)   Wt 106 lb 9.6 oz (48.4 kg)   LMP 10/02/2021   BMI 20.14 kg/m   Wt Readings from Last 3 Encounters:  10/16/21 106 lb 9.6 oz (48.4 kg)  04/19/21 109 lb 9.6 oz (49.7 kg)  10/03/20 106 lb 12.8 oz (48.4 kg)    General appearance: alert, no distress, WD/WN, Caucasian female Skin: unremarkable HEENT: normocephalic, conjunctiva/corneas normal, sclerae anicteric, PERRLA, EOMi, nares patent, no discharge or erythema, pharynx normal Oral cavity: MMM, tongue normal, teeth normal Neck: supple, no lymphadenopathy, no thyromegaly, no masses, normal ROM, no bruits Chest: non tender, normal shape and expansion Heart: RRR, normal S1, S2, no murmurs Lungs: CTA bilaterally, no wheezes, rhonchi, or rales Abdomen: +bs, soft, non tender, non distended, no masses, no hepatomegaly, no splenomegaly, no bruits Back: non tender, normal ROM, no scoliosis Musculoskeletal: upper extremities non tender, no obvious deformity, normal ROM throughout, lower extremities non tender, no obvious deformity, normal ROM throughout Extremities: no edema, no cyanosis, no clubbing Pulses: 2+ symmetric, upper and lower extremities, normal cap refill Neurological: alert, oriented x 3, CN2-12 intact, strength normal upper extremities and lower extremities, sensation normal throughout, DTRs 2+ throughout, no cerebellar signs, gait normal Psychiatric: normal affect, behavior normal, pleasant  Breast/gyn/rectal - deferred to gynecology   Assessment and Plan :   Encounter Diagnoses  Name Primary?   Encounter for health maintenance examination in adult Yes   Urinary frequency    Need for Tdap vaccination    Screening for lipid disorders    Vaccine counseling    Sleep disturbance    Family history of hyperlipidemia      Physical exam - discussed and counseled on healthy lifestyle, diet, exercise, preventative care, vaccinations, sick and well care, proper use of emergency dept and after hours care, and addressed  their concerns.    Health screening: Advised they see their eye doctor yearly for routine vision care. Advised they see their dentist yearly for routine dental care including hygiene visits twice yearly. See your gynecologist yearly for routine gynecological care.   Cancer screening Counseled on self breast exams, mammograms, cervical cancer screening   Vaccinations: Immunization History  Administered Date(s) Administered   DTaP 06/10/1994, 08/11/1994, 10/14/1994, 07/09/1995, 06/13/1998   HPV 9-valent 08/25/2019, 10/26/2019, 02/22/2020   Hepatitis A, Adult 05/29/2014, 06/05/2015   Hepatitis B 05/08/1994, 10/14/1994, 07/09/1995, 06/13/1998   HiB (PRP-OMP) 06/10/1994, 08/11/1994, 10/14/1994, 04/09/1995   IPV 06/10/1994, 08/11/1994, 10/14/1994, 06/13/1998   Influenza Split 10/23/2007, 09/05/2011, 08/19/2015, 09/13/2021   Influenza Whole 10/31/2006, 10/01/2009, 10/11/2010   Influenza,inj,Quad PF,6+ Mos 10/03/2020   Influenza-Unspecified 09/13/2021   MMR 05/01/1995, 06/13/1998   Meningococcal Conjugate 12/22/2006, 07/08/2011   PFIZER(Purple Top)SARS-COV-2 Vaccination 12/13/2019, 01/04/2020, 10/17/2020   PPD Test 05/26/2014, 06/05/2014, 05/15/2015   Tdap 07/08/2011   Varicella 07/09/1995, 07/08/2011    Advised yearly influenza vaccine  Counseled on the Tdap (tetanus, diptheria, and acellular pertussis) vaccine.  Vaccine information sheet given. Tdap vaccine given after consent obtained.   Separate significant issues discussed: Urinary frequency, suggestive of OAB - begin trial of Detrol.   Discussed risks/benefits of medication.  Refer to urology  Asthma - prior exercise induced but no problems in last 2 years  Hx/o HSV - no recent problems   Tristina was seen today for fasting cpe .  Diagnoses and all orders for this visit:  Encounter for health maintenance examination in adult -     Lipid panel -     Basic metabolic panel -     CBC -     Urinalysis, Routine w reflex  microscopic  Urinary frequency -     Ambulatory referral to Urology -     Urinalysis, Routine w reflex microscopic  Need for Tdap vaccination  Screening for lipid disorders  Vaccine counseling  Sleep disturbance  Family history of hyperlipidemia  Other orders -     tolterodine (DETROL LA) 2 MG 24 hr capsule; Take 1 capsule (2 mg total) by mouth daily.   Follow-up pending labs, yearly for physical

## 2021-10-16 NOTE — Addendum Note (Signed)
Addended by: Herminio Commons A on: 10/16/2021 10:03 AM   Modules accepted: Orders

## 2021-11-07 ENCOUNTER — Other Ambulatory Visit: Payer: Self-pay | Admitting: Medical

## 2022-01-23 ENCOUNTER — Other Ambulatory Visit: Payer: Self-pay

## 2022-01-23 ENCOUNTER — Ambulatory Visit (INDEPENDENT_AMBULATORY_CARE_PROVIDER_SITE_OTHER): Payer: No Typology Code available for payment source | Admitting: Medical

## 2022-01-23 VITALS — BP 118/80 | HR 72 | Temp 98.6°F | Wt 105.8 lb

## 2022-01-23 DIAGNOSIS — R87610 Atypical squamous cells of undetermined significance on cytologic smear of cervix (ASC-US): Secondary | ICD-10-CM | POA: Diagnosis not present

## 2022-01-23 DIAGNOSIS — R1909 Other intra-abdominal and pelvic swelling, mass and lump: Secondary | ICD-10-CM

## 2022-01-23 NOTE — Progress Notes (Signed)
Subjective:  Kelly Dean is a 26 y.o. female who presents for Chief Complaint  Patient presents with   mass     Mass under skin in groin area. Not sure if its lymph node or something else     Here for concern in groin region.  She noticed lumps in her bilat groin area the last few months. She notes family history of cancers, so concerned about this.    Saw gynecology in January, just recently has pap, no reported abnormality. Pap showed ASCUS.    No current pain, no abdominal or back pain.  No vaginal symptoms, no odor, no discharge.  No back pain.  No fever. No change in appetite.  No nausea.   Been with boyfriend for 2 years.   Has hx/o HSV but no outbreak in over a year  She just recent did ab exercises so abdomen is sore, works out regularly  No other aggravating or relieving factors.    No other c/o.  Past Medical History:  Diagnosis Date   Allergy    Asthma 05/2015   exercise induced, mild intermittent   Contraception management    [facility], started [facility]   Eczema    Encounter for routine gynecological examination    sees Dr. Renaldo Dean, [facility]   Past Surgical History:  Procedure Laterality Date   SCLEROTHERAPY  2019   vein therapy for varicosities/spider veins   WISDOM TOOTH EXTRACTION  2017   Family History  Problem Relation Age of Onset   Hypertension Father    Hyperlipidemia Father    Migraines Mother    Hyperlipidemia Mother    Arthritis Paternal Grandmother    Asthma Paternal Grandmother    Hyperlipidemia Paternal Grandmother    Hypertension Paternal Grandmother    Obesity Paternal Grandmother    Heart disease Paternal Grandfather 49   Hyperlipidemia Paternal Grandfather    Hypertension Paternal Grandfather    Cancer Paternal Aunt        breast   Cancer Paternal Uncle        prostate   Heart disease Maternal Grandmother        surgery for PDA age 13, and aortic valve replacement at 33   Cancer Maternal Grandmother         liposarcoma   Hearing loss Maternal Grandmother    Stroke Neg Hx    Diabetes Neg Hx      The following portions of the patient's history were reviewed and updated as appropriate: allergies, current medications, past family history, past medical history, past social history, past surgical history and problem list.  ROS Otherwise as in subjective above    Objective: BP 118/80    Pulse 72    Temp 98.6 F (37 C)    Wt 105 lb 12.8 oz (48 kg)    BMI 19.99 kg/m   Wt Readings from Last 3 Encounters:  01/23/22 105 lb 12.8 oz (48 kg)  10/16/21 106 lb 9.6 oz (48.4 kg)  04/19/21 109 lb 9.6 oz (49.7 kg)    General appearance: alert, no distress, well developed, well nourished, lean white female Neck: supple, no lymphadenopathy, no thyromegaly, no masses Axilla, supraclavicular - no lymphadenopathy Abdomen: +bs, soft, non tender, non distended, no masses, no hepatomegaly, no splenomegaly Bilat inguinal/groin region with palpable few nodes, but no enlarged, firm or hard nodes Ext: no edema   Assessment: Encounter Diagnoses  Name Primary?   Groin lump Yes   Pap smear abnormality  of cervix with ASCUS favoring benign      Plan: Reviewed 10/2021 labs, normal CBC other than slightly elevated RDW.   Reviewed pap smear from gynecology from 01/01/2021, ASCUS but negative HPV.  Advised that given her lean body habitus it is easier to palpate nodes, but no obvious abnormally big, tender, swollen or firm nodes today.  No worrisome symptoms  she is in stable 2 year relationship without concern for STD.  Reassured.  Advised if any new symptom in the coming weeks such as abdominal or pelvic tendnerss or discomfort, vaginal symptoms, urinary symptoms, fever, nausea, enlarging or tender lymph nodes, fatigue or other, then recheck right away.    Kelly Dean was seen today for mass .  Diagnoses and all orders for this visit:  Groin lump  Pap smear abnormality of cervix with ASCUS favoring  benign    Follow up: prn

## 2022-04-25 ENCOUNTER — Telehealth (INDEPENDENT_AMBULATORY_CARE_PROVIDER_SITE_OTHER): Payer: No Typology Code available for payment source | Admitting: Physician Assistant

## 2022-04-25 ENCOUNTER — Encounter: Payer: Self-pay | Admitting: Physician Assistant

## 2022-04-25 ENCOUNTER — Other Ambulatory Visit: Payer: No Typology Code available for payment source

## 2022-04-25 VITALS — Temp 98.6°F | Wt 105.0 lb

## 2022-04-25 DIAGNOSIS — R051 Acute cough: Secondary | ICD-10-CM

## 2022-04-25 DIAGNOSIS — R509 Fever, unspecified: Secondary | ICD-10-CM

## 2022-04-25 DIAGNOSIS — J029 Acute pharyngitis, unspecified: Secondary | ICD-10-CM

## 2022-04-25 LAB — POCT INFLUENZA A/B
Influenza A, POC: NEGATIVE
Influenza B, POC: NEGATIVE

## 2022-04-25 LAB — POCT RAPID STREP A (OFFICE): Rapid Strep A Screen: NEGATIVE

## 2022-04-25 LAB — POC COVID19 BINAXNOW: SARS Coronavirus 2 Ag: NEGATIVE

## 2022-04-25 NOTE — Progress Notes (Signed)
Start time: 11:43 am ?End time: 12:09 pm ? ?Virtual Visit via Video Note ? ? Patient ID: Kelly Dean, female    DOB: Sep 30, 1996, 26 y.o.   MRN: [id number] ? ?I connected with above patient on 04/25/22 by a video enabled telemedicine application and verified that I am speaking with the correct person using two identifiers. ? ?Location: ?Patient: home ?Provider: office ?  ?I discussed the limitations of evaluation and management by telemedicine and the availability of in person appointments. The patient expressed understanding and agreed to proceed. ? ?History of Present Illness: ? ?Chief Complaint  ?Patient presents with  ? Acute Visit  ?  Virtual- sore throat, loss voice,head congestion, fever wed night. Did home covid test today and it was negative.  ? ?Reports a 4 day history of a sore, scratchy throat and a raspy voice, temperature of  100.4, dry cough, head congestion productive of yellow/green mucous; denies headache, denies sick contacts but works in [employer], denies recent travel,  +low grade fever/+ chills, denies nausea/vomiting, denies diarrhea/constipation; denies allergic rhinitis symptoms; reports hx/o exercise-induced asthma that isn't bothering her now, denies wheezing; ibuprofen, decongestant, mucinex are somewhat helpful. ? ? ?  ?Observations/Objective: ? ?Temp 98.6 ?F (37 ?C)   Wt 105 lb (47.6 kg)   BMI 19.84 kg/m?  ? ? ?Assessment: ?Encounter Diagnoses  ?Name Primary?  ? Sore throat Yes  ? Acute cough   ? Fever, unspecified fever cause   ? ? ? ?Plan: ?Come to office for rapid strep test, rapid COVID test, PCR COVID test, and rapid flu test ? ?Rest, increase clear fluids, OTC Tylenol (acetamenophen) for body aches, headaches, fever, chills as needed.  ? ?You can take an OTC decongestant like Sudafed or phenylephrine to help dry up nasal congestion. Do Not take this medicine if you have high blood pressure or heart palpitations. You can go to a store with a pharmacy and ask them to help you  find these medicines. ? ?You can take an OTC expectorant like guaifenesin or Mucinex to help decrease head and nasal congestion.  ? ?Increase rest and liquids, OTC Tylenol (generic is acetamenophen), Advil or Motrin (generic is ibuprofen) ALWAYS TAKE WITH FOOD, Aleve (generic is naprosyn sodium) ALWAYS TAKE WITH FOOD, warm salt water gargle, OTC throat spray, cough drops as needed. ? ? ?[name] was seen today for acute visit. ? ?Diagnoses and all orders for this visit: ? ?Sore throat ?-     POCT rapid strep A ? ?Acute cough ?-     POC COVID-19 BinaxNow ?-     Novel Coronavirus, NAA ([facility]) ?-     POCT Influenza A/B ? ?Fever, unspecified fever cause ?-     POC COVID-19 BinaxNow ?-     Novel Coronavirus, NAA ([facility]) ?-     POCT Influenza A/B ?-     POCT rapid strep A ? ?Rapid strep, rapid flu, and rapid COVID tests are negative. Patient may have a work note to be out for Saturday, 04/26/22, and return Sunday 04/27/2022 ? ?Follow up: return for appointment that was already scheduled 10/17/2022 ? ? ?I discussed the assessment and treatment plan with the patient. The patient was provided an opportunity to ask questions and all were answered. The patient agreed with the plan and demonstrated an understanding of the instructions.  ?  ?The patient was advised to call back or seek an in-person evaluation if the symptoms worsen or if the condition fails to improve as anticipated. For emergencies  go to Urgent Care or the Emergency Department for immediate evaluation.  ? ?I spent 15 minutes dedicated to the care of this patient, including pre-visit review of records, face to face time, post-visit ordering of testing and documentation. ? ? ? ?Jake Shark, PA-C ?

## 2022-04-25 NOTE — Progress Notes (Signed)
Please call patient to let her know that her rapid strep test, rapid flu test, and rapid COVID tests were negative. ? ?She can increase rest and liquids, OTC Tylenol (generic is acetamenophen), Advil or Motrin (generic is ibuprofen) ALWAYS TAKE WITH FOOD, Aleve (generic is naprosyn sodium) ALWAYS TAKE WITH FOOD, warm salt water gargle, OTC throat spray, cough drops as needed. ? 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  ?Thanks. ?

## 2022-04-25 NOTE — Progress Notes (Signed)
Yes. 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 Thanks.

## 2022-04-25 NOTE — Patient Instructions (Signed)
Rest, increase clear fluids, OTC Tylenol (acetamenophen) for body aches, headaches, fever, chills as needed.  ? ?You can take an OTC decongestant like Sudafed or phenylephrine to help dry up nasal congestion. Do Not take this medicine if you have high blood pressure or heart palpitations. You can go to a store with a pharmacy and ask them to help you find these medicines. ? ?You can take an OTC expectorant like guaifenesin or Mucinex to help decrease head and nasal congestion.  ? ?Increase rest and liquids, OTC Tylenol (generic is acetamenophen), Advil or Motrin (generic is ibuprofen) ALWAYS TAKE WITH FOOD, Aleve (generic is naprosyn sodium) ALWAYS TAKE WITH FOOD, warm salt water gargle, OTC throat spray, cough drops as needed. ?

## 2022-04-26 LAB — NOVEL CORONAVIRUS, NAA: SARS-CoV-2, NAA: NOT DETECTED

## 2022-06-27 ENCOUNTER — Other Ambulatory Visit (HOSPITAL_COMMUNITY): Payer: Self-pay

## 2022-06-27 MED ORDER — VALACYCLOVIR HCL 500 MG PO TABS
500.0000 mg | ORAL_TABLET | Freq: Two times a day (BID) | ORAL | 6 refills | Status: AC
  Filled 2022-06-27: qty 30, 15d supply, fill #0

## 2022-08-20 ENCOUNTER — Encounter: Payer: Self-pay | Admitting: Internal Medicine

## 2022-09-02 ENCOUNTER — Ambulatory Visit (INDEPENDENT_AMBULATORY_CARE_PROVIDER_SITE_OTHER): Payer: No Typology Code available for payment source | Admitting: Medical

## 2022-09-02 VITALS — BP 110/60 | HR 60 | Wt 106.8 lb

## 2022-09-02 DIAGNOSIS — R4589 Other symptoms and signs involving emotional state: Secondary | ICD-10-CM

## 2022-09-02 DIAGNOSIS — G4700 Insomnia, unspecified: Secondary | ICD-10-CM | POA: Diagnosis not present

## 2022-09-02 DIAGNOSIS — F430 Acute stress reaction: Secondary | ICD-10-CM

## 2022-09-02 MED ORDER — LORAZEPAM 0.5 MG PO TABS: 0.2500 mg | ORAL_TABLET | Freq: Every evening | ORAL | 0 refills | Status: DC | PRN

## 2022-09-02 MED ORDER — FLUOXETINE HCL 10 MG PO CAPS: 10.0000 mg | ORAL_CAPSULE | Freq: Every day | ORAL | 1 refills | Status: DC

## 2022-09-02 NOTE — Progress Notes (Signed)
Subjective:  Kelly Dean is a 26 y.o. female who presents for Chief Complaint  Patient presents with   discuss anxiety medication    Discuss anxiety and depression. Getting worse recently. Seeing counselor every couple weeks and they recommended it.      Here for depression , anxiety, been seeing a counselor for 2 months.   Seeing counselor every 2-3 weeks.    She and counselor have looked at some ways to help cope.   Source of recent stressors is relationship related.   She is dating a guy for 3 years.  He has had some past alcohol issues.  Sometimes her parents and the boyfriend have some issues which is causing a lot of stress.  In addition she notes that she gets stressed and overwhelmed easily, more irritable than usual.   Lives alone for now, but her and boyfriend are planning to move in together next year.  Relationship with parents are great, they are very close.  She is exercising.  She doesn't have much energy.  Not sleeping well, waking several times per night.   No SI/HI.  Her mother is on Lexapro.   There are other family members with mental healthy issues.  No other aggravating or relieving factors.    No other c/o.  Past Medical History:  Diagnosis Date   Allergy    Asthma 05/2015   exercise induced, mild intermittent   Contraception management    [facility], [facility]   Eczema    Encounter for routine gynecological examination    sees Dr. Julien Girt, [facility]   Current Outpatient Medications on File Prior to Visit  Medication Sig Dispense Refill   FIBER PO Take 2 tablets by mouth daily.     levonorgestrel-ethinyl estradiol (NORDETTE) 0.15-30 MG-MCG tablet Take 1 tablet by mouth daily.     Multiple Vitamins-Minerals (MULTIVITAMIN WITH MINERALS) tablet Take 1 tablet by mouth daily.     valACYclovir (VALTREX) 500 MG tablet Take 500 mg by mouth 2 (two) times daily.     valACYclovir (VALTREX) 500 MG tablet Take 1 tablet (500 mg total) by mouth 2  (two) times daily as directed 30 tablet 6   No current facility-administered medications on file prior to visit.     The following portions of the patient's history were reviewed and updated as appropriate: allergies, current medications, past family history, past medical history, past social history, past surgical history and problem list.  ROS Otherwise as in subjective above  Objective: BP 110/60   Pulse 60   Wt 106 lb 12.8 oz (48.4 kg)   BMI 20.18 kg/m   Gen: wd, wn ,nad Psych: pleasant, good eye contact, answers question appropriate   Assessment: Encounter Diagnoses  Name Primary?   Acute stress reaction Yes   Depressed mood    Insomnia, unspecified type      Plan: Discussed concerns. I reviewed her abnormal PHQ9 and anxiety screening questionnaire.  Continue counseling.  Begin trial of Prozac. Short term only begin trial of low dose ativan for sleep.  Discussed risks, benefits and proper use of medicaiton.  She has f/u with counseling tomorrow.  Discussed some ways to cope as well.     Emree was seen today for discuss anxiety medication.  Diagnoses and all orders for this visit:  Acute stress reaction  Depressed mood  Insomnia, unspecified type  Other orders -     LORazepam (ATIVAN) 0.5 MG tablet; Take 0.5 tablets (0.25 mg  total) by mouth at bedtime as needed for anxiety. -     FLUoxetine (PROZAC) 10 MG capsule; Take 1 capsule (10 mg total) by mouth daily.    Follow up: 3-4 wk, sooner prn

## 2022-09-23 ENCOUNTER — Encounter: Payer: Self-pay | Admitting: Internal Medicine

## 2022-09-24 ENCOUNTER — Ambulatory Visit: Payer: No Typology Code available for payment source | Admitting: Medical

## 2022-09-25 ENCOUNTER — Other Ambulatory Visit: Payer: Self-pay | Admitting: Medical

## 2022-10-02 ENCOUNTER — Ambulatory Visit (INDEPENDENT_AMBULATORY_CARE_PROVIDER_SITE_OTHER): Payer: No Typology Code available for payment source | Admitting: Medical

## 2022-10-02 VITALS — BP 110/70 | HR 75 | Wt 105.0 lb

## 2022-10-02 DIAGNOSIS — F419 Anxiety disorder, unspecified: Secondary | ICD-10-CM | POA: Diagnosis not present

## 2022-10-02 DIAGNOSIS — F32A Depression, unspecified: Secondary | ICD-10-CM | POA: Diagnosis not present

## 2022-10-02 MED ORDER — FLUOXETINE HCL 20 MG PO CAPS: 20.0000 mg | ORAL_CAPSULE | Freq: Every day | ORAL | 1 refills | Status: DC

## 2022-10-02 NOTE — Patient Instructions (Signed)
We discussed ways to deal with stress and anxiety. I recommend regular exercise such as 30 minutes or more most days of the week such as walking running and bicycling I recommend taking some time to meditate or pray daily to help slow racing thoughts. I recommend working on relaxation techniques such as deep breathing exercises in a comfortable position relaxing your body.  There are free Apps on the smart phone for this for example Consider getting a massage Journal or use diary to express your ideas on paper to cope with anxiety and stress Work on time management, use a calendar or plan out things to avoid stressing about things. Find ways to utilize your time to include exercise and personal "me" time. Some people use aromatherapy such as lavender to relax Some people use herbal teas to help calm their mood Spend some time with animals or your pet if you have one Consider seeing a counselor to help deal with anxiety and work on specific techniques 

## 2022-10-02 NOTE — Progress Notes (Signed)
Subjective:  Kelly Dean is a 26 y.o. female who presents for Chief Complaint  Patient presents with   3 follow-up    3 week follow-up. Doing well prozac.       Here for follow-up on depressed mood and anxiety.  Last visit we started Prozac.  She feels a significant improvement on the medication.  Not crying all the time, not getting overwhelmed as last visit, less irritable.   Can still having anxious times.  Still seeing counselor monthly.  She tried Ativan but that really did not help much in terms of sleep.  She has talked with her counselor about sleep.  She was advised a couple different techniques including yoga at bedtime.  She is working on some of those techniques.   still waking up nightly, multiple times  Last visit a month ago she had been having a change in mood for the prior 2 months.   Source of recent stressors is relationship related.   She is dating a guy for 3 years.  He has had some past alcohol issues.  Sometimes her parents and the boyfriend have some issues which is causing a lot of stress.  In addition she notes that she gets stressed and overwhelmed easily, more irritable than usual.   Lives alone for now, but her and boyfriend are planning to move in together next year.  Relationship with parents are great, they are very close.  She is exercising.  She doesn't have much energy.  Not sleeping well, waking several times per night.   No SI/HI.  Her mother is on Lexapro.   There are other family members with mental healthy issues.  No other aggravating or relieving factors.    No other c/o.  Past Medical History:  Diagnosis Date   Allergy    Asthma 05/2015   exercise induced, mild intermittent   Contraception management    [facility], started [facility] 2016   Eczema    Encounter for routine gynecological examination    sees Dr. Julien Girt, [facility]   Current Outpatient Medications on File Prior to Visit  Medication Sig Dispense Refill   FIBER PO Take  2 tablets by mouth daily.     FLUoxetine (PROZAC) 10 MG capsule Take 1 capsule (10 mg total) by mouth daily. 30 capsule 1   levonorgestrel-ethinyl estradiol (NORDETTE) 0.15-30 MG-MCG tablet Take 1 tablet by mouth daily.     Multiple Vitamins-Minerals (MULTIVITAMIN WITH MINERALS) tablet Take 1 tablet by mouth daily.     valACYclovir (VALTREX) 500 MG tablet Take 1 tablet (500 mg total) by mouth 2 (two) times daily as directed 30 tablet 6   LORazepam (ATIVAN) 0.5 MG tablet Take 0.5 tablets (0.25 mg total) by mouth at bedtime as needed for anxiety. (Patient not taking: Reported on 10/02/2022) 10 tablet 0   No current facility-administered medications on file prior to visit.     The following portions of the patient's history were reviewed and updated as appropriate: allergies, current medications, past family history, past medical history, past social history, past surgical history and problem list.  ROS Otherwise as in subjective above  Objective: BP 110/70   Pulse 75   Wt 105 lb (47.6 kg)   BMI 19.84 kg/m   Gen: wd, wn ,nad Psych: pleasant, good eye contact, answers question appropriate   Assessment: Encounter Diagnosis  Name Primary?   Anxiety and depression Yes      Plan: I am glad to hear she  is having improvements.  We will increase Prozac dose today at her request.  She will work on strategies as discussed for insomnia and sleep.  She will continue to work on strategies to help cope with anxiety and stressors.  She will continue counseling.  Advised to follow-up soon if any issues with the medicine dose change.  She does have a physical visit coming up soon with me  Thao was seen today for 3 follow-up.  Diagnoses and all orders for this visit:  Anxiety and depression  Other orders -     FLUoxetine (PROZAC) 20 MG capsule; Take 1 capsule (20 mg total) by mouth daily.     Follow up: soon as planned for physical

## 2022-10-17 ENCOUNTER — Encounter: Payer: No Typology Code available for payment source | Admitting: Medical

## 2022-10-22 ENCOUNTER — Encounter: Payer: Self-pay | Admitting: Medical

## 2022-10-22 ENCOUNTER — Ambulatory Visit (INDEPENDENT_AMBULATORY_CARE_PROVIDER_SITE_OTHER): Payer: No Typology Code available for payment source | Admitting: Medical

## 2022-10-22 VITALS — BP 110/62 | HR 71 | Ht 60.25 in | Wt 103.0 lb

## 2022-10-22 DIAGNOSIS — Z0000 Encounter for general adult medical examination without abnormal findings: Secondary | ICD-10-CM | POA: Diagnosis not present

## 2022-10-22 DIAGNOSIS — Z83438 Family history of other disorder of lipoprotein metabolism and other lipidemia: Secondary | ICD-10-CM

## 2022-10-22 DIAGNOSIS — G2581 Restless legs syndrome: Secondary | ICD-10-CM

## 2022-10-22 DIAGNOSIS — F32A Depression, unspecified: Secondary | ICD-10-CM

## 2022-10-22 DIAGNOSIS — F419 Anxiety disorder, unspecified: Secondary | ICD-10-CM

## 2022-10-22 DIAGNOSIS — G479 Sleep disorder, unspecified: Secondary | ICD-10-CM

## 2022-10-22 DIAGNOSIS — Z7185 Encounter for immunization safety counseling: Secondary | ICD-10-CM | POA: Diagnosis not present

## 2022-10-22 DIAGNOSIS — J4520 Mild intermittent asthma, uncomplicated: Secondary | ICD-10-CM | POA: Diagnosis not present

## 2022-10-22 NOTE — Progress Notes (Signed)
Subjective:   HPI  Kelly Dean is a 26 y.o. female who presents for Chief Complaint  Patient presents with   fasitng cpe    Fasting cpe, having trouble sleeping still, sees obgyn     Patient Care Team: Damarrion Mimbs, Camelia Eng, PA-C as PCP - General (Family Medicine) Marylynn Pearson, MD as Consulting Physician (Obstetrics and Gynecology) Sees dentist  Concerns: I saw her recently for follow-up on mood and sleep.  She is still having some sleep issues.  Ativan helps some with sleep but still having some problems getting good sleep.  Having trouble getting to sleep and staying asleep.  She also has developed some restless leg symptoms since we increased the dose of her Prozac recently.  She feels sensations in her legs like they are irritated and she finds herself moving a lot when she is still or at night.  Getting up from the ground helps.  But this has become a recent problem.  She also found out her mother has restless leg syndrome and takes an over-the-counter medicine for this.  She is seeing her counselor monthly although she missed last month's visit.  She sees him today.  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  History of asthma but no recent issues  Periods are regular, not heavy  Reviewed their medical, surgical, family, social, medication, and allergy history and updated chart as appropriate.  Past Medical History:  Diagnosis Date   Allergy    Asthma 05/2015   exercise induced, mild intermittent   Contraception management    [facility], [facility]   Eczema    Encounter for routine gynecological examination    sees Dr. Julien Girt, [facility]    Family History  Problem Relation Age of Onset   Hypertension Father    Hyperlipidemia Father    Migraines Mother    Hyperlipidemia Mother    Arthritis Paternal Grandmother    Asthma Paternal Grandmother    Hyperlipidemia Paternal Grandmother    Hypertension  Paternal Grandmother    Obesity Paternal Grandmother    Heart disease Paternal Grandfather 66   Hyperlipidemia Paternal Grandfather    Hypertension Paternal Grandfather    Cancer Paternal Aunt        breast   Cancer Paternal Uncle        prostate   Heart disease Maternal Grandmother        surgery for PDA age 11, and aortic valve replacement at 90   Cancer Maternal Grandmother        liposarcoma   Hearing loss Maternal Grandmother    Stroke Neg Hx    Diabetes Neg Hx      Current Outpatient Medications:    FIBER PO, Take 2 tablets by mouth daily., Disp: , Rfl:    FLUoxetine (PROZAC) 20 MG capsule, Take 1 capsule (20 mg total) by mouth daily., Disp: 30 capsule, Rfl: 1   levonorgestrel-ethinyl estradiol (NORDETTE) 0.15-30 MG-MCG tablet, Take 1 tablet by mouth daily., Disp: , Rfl:    Multiple Vitamins-Minerals (MULTIVITAMIN WITH MINERALS) tablet, Take 1 tablet by mouth daily., Disp: , Rfl:    valACYclovir (VALTREX) 500 MG tablet, Take 1 tablet (500 mg total) by mouth 2 (two) times daily as directed, Disp: 30 tablet, Rfl: 6  Allergies  Allergen Reactions   Penicillins Rash and Hives   Lactose       Review of Systems Constitutional: -fever, -chills, -sweats, -unexpected weight  change, -decreased appetite, -fatigue Allergy: -sneezing, -itching, -congestion Dermatology: -changing moles, --rash, -lumps ENT: -runny nose, -ear pain, -sore throat, -hoarseness, -sinus pain, -teeth pain, - ringing in ears, -hearing loss, -nosebleeds Cardiology: -chest pain, -palpitations, -swelling, -difficulty breathing when lying flat, -waking up short of breath Respiratory: -cough, -shortness of breath, -difficulty breathing with exercise or exertion, -wheezing, -coughing up blood Gastroenterology: -abdominal pain, -nausea, -vomiting, -diarrhea, -constipation, -blood in stool, -changes in bowel movement, -difficulty swallowing or eating Hematology: -bleeding, -bruising  Musculoskeletal: -joint aches,  -muscle aches, -joint swelling, -back pain, -neck pain, -cramping, -changes in gait Ophthalmology: denies vision changes, eye redness, itching, discharge Urology: -burning with urination, -difficulty urinating, -blood in urine, -urinary frequency, -urgency, -incontinence Neurology: -headache, -weakness, -tingling, -numbness, -memory loss, -falls, -dizziness Psychology: +depressed mood, -agitation, +sleep problems Breast/gyn: -breast tendnerss, -discharge, -lumps, -vaginal discharge,- irregular periods, -heavy periods      09/02/2022    3:16 PM 01/23/2022   10:56 AM 10/16/2021    9:05 AM 10/03/2020    8:48 AM 08/25/2019    8:35 AM  Depression screen PHQ 2/9  Decreased Interest 3 0 0 0 0  Down, Depressed, Hopeless 3 0 0 0 0  PHQ - 2 Score 6 0 0 0 0  Altered sleeping 3   0   Tired, decreased energy 3   0   Change in appetite 1   0   Feeling bad or failure about yourself  1   0   Trouble concentrating 3   0   Moving slowly or fidgety/restless 3   0   Suicidal thoughts 0   0   PHQ-9 Score 20   0   Difficult doing work/chores Not difficult at all           Objective:  BP 110/62   Pulse 71   Ht 5' 0.25" (1.53 m)   Wt 103 lb (46.7 kg)   BMI 19.95 kg/m   General appearance: alert, no distress, WD/WN, Caucasian female Skin: unremarkable HEENT: normocephalic, conjunctiva/corneas normal, sclerae anicteric, PERRLA, EOMi, nares patent, no discharge or erythema, pharynx normal Oral cavity: MMM, tongue normal, teeth normal Neck: supple, no lymphadenopathy, no thyromegaly, no masses, normal ROM, no bruits Chest: non tender, normal shape and expansion Heart: RRR, normal S1, S2, no murmurs Lungs: CTA bilaterally, no wheezes, rhonchi, or rales Abdomen: +bs, soft, non tender, non distended, no masses, no hepatomegaly, no splenomegaly, no bruits Back: non tender, normal ROM, no scoliosis Musculoskeletal: upper extremities non tender, no obvious deformity, normal ROM throughout, lower  extremities non tender, no obvious deformity, normal ROM throughout Extremities: no edema, no cyanosis, no clubbing Pulses: 2+ symmetric, upper and lower extremities, normal cap refill Neurological: alert, oriented x 3, CN2-12 intact, strength normal upper extremities and lower extremities, sensation normal throughout, DTRs 2+ throughout, no cerebellar signs, gait normal Psychiatric: normal affect, behavior normal, pleasant  Breast/gyn/rectal - deferred to gynecology     Assessment and Plan :   Encounter Diagnoses  Name Primary?   Encounter for health maintenance examination in adult Yes   Vaccine counseling    Family history of hyperlipidemia    Mild intermittent asthma, unspecified whether complicated    RLS (restless legs syndrome)    Sleep disturbance      This visit was a preventative care visit, also known as wellness visit or routine physical.   Topics typically include healthy lifestyle, diet, exercise, preventative care, vaccinations, sick and well care, proper use of emergency dept and after hours care,  as well as other concerns.     Recommendations: Continue to return yearly for your annual wellness and preventative care visits.  This gives [place] a chance to discuss healthy lifestyle, exercise, vaccinations, review your chart record, and perform screenings where appropriate.  I recommend you see your eye doctor yearly for routine vision care.  I recommend you see your dentist yearly for routine dental care including hygiene visits twice yearly.   Vaccination recommendations were reviewed Immunization History  Administered Date(s) Administered   DTaP 06/10/1994, 08/11/1994, 10/14/1994, 07/09/1995, 06/13/1998   HIB (PRP-OMP) 06/10/1994, 08/11/1994, 10/14/1994, 04/09/1995   HPV 9-valent 08/25/2019, 10/26/2019, 02/22/2020   Hepatitis A, Adult 05/29/2014, 06/05/2015   Hepatitis B 05/08/1994, 10/14/1994, 07/09/1995, 06/13/1998   IPV 06/10/1994, 08/11/1994, 10/14/1994,  06/13/1998   Influenza Split 10/23/2007, 09/05/2011, 08/19/2015, 09/13/2021   Influenza Whole 10/31/2006, 10/01/2009, 10/11/2010   Influenza,inj,Quad PF,6+ Mos 10/03/2020   Influenza-Unspecified 09/13/2021, 10/06/2022   MMR 05/01/1995, 06/13/1998   Meningococcal Conjugate 12/22/2006, 07/08/2011   PFIZER(Purple Top)SARS-COV-2 Vaccination 12/13/2019, 01/04/2020, 10/17/2020   PPD Test 05/26/2014, 06/05/2014, 05/15/2015   Tdap 07/08/2011, 10/16/2021   Varicella 07/09/1995, 07/08/2011   She notes recent flu shot at work   Screening for cancer: Colon cancer screening: Age 40  Breast cancer screening: You should perform a self breast exam monthly.   We reviewed recommendations for regular mammograms and breast cancer screening.  Cervical cancer screening: We reviewed recommendations for pap smear screening.   Skin cancer screening: Check your skin regularly for new changes, growing lesions, or other lesions of concern Come in for evaluation if you have skin lesions of concern.  Lung cancer screening: If you have a greater than 20 pack year history of tobacco use, then you may qualify for lung cancer screening with a chest CT scan.   Please call your insurance company to inquire about coverage for this test.  We currently don't have screenings for other cancers besides breast, cervical, colon, and lung cancers.  If you have a strong family history of cancer or have other cancer screening concerns, please let me know.    Bone health: Get at least 150 minutes of aerobic exercise weekly Get weight bearing exercise at least once weekly Bone density test:  A bone density test is an imaging test that uses a type of X-ray to measure the amount of calcium and other minerals in your bones. The test may be used to diagnose or screen you for a condition that causes weak or thin bones (osteoporosis), predict your risk for a broken bone (fracture), or determine how well your osteoporosis  treatment is working. The bone density test is recommended for females 60 and older, or females or males <66 if certain risk factors such as thyroid disease, long term use of steroids such as for asthma or rheumatological issues, vitamin D deficiency, estrogen deficiency, family history of osteoporosis, self or family history of fragility fracture in first degree relative.    Heart health: Get at least 150 minutes of aerobic exercise weekly Limit alcohol It is important to maintain a healthy blood pressure and healthy cholesterol numbers  Heart disease screening: Screening for heart disease includes screening for blood pressure, fasting lipids, glucose/diabetes screening, BMI height to weight ratio, reviewed of smoking status, physical activity, and diet.    Goals include blood pressure 120/80 or less, maintaining a healthy lipid/cholesterol profile, preventing diabetes or keeping diabetes numbers under good control, not smoking or using tobacco products, exercising most days per week or at least  150 minutes per week of exercise, and eating healthy variety of fruits and vegetables, healthy oils, and avoiding unhealthy food choices like fried food, fast food, high sugar and high cholesterol foods.    Other tests may possibly include EKG test, CT coronary calcium score, echocardiogram, exercise treadmill stress test.    Medical care options: I recommend you continue to seek care here first for routine care.  We try really hard to have available appointments Monday through Friday daytime hours for sick visits, acute visits, and physicals.  Urgent care should be used for after hours and weekends for significant issues that cannot wait till the next day.  The emergency department should be used for significant potentially life-threatening emergencies.  The emergency department is expensive, can often have long wait times for less significant concerns, so try to utilize primary care, urgent care, or  telemedicine when possible to avoid unnecessary trips to the emergency department.  Virtual visits and telemedicine have been introduced since the pandemic started in 2020, and can be convenient ways to receive medical care.  We offer virtual appointments as well to assist you in a variety of options to seek medical care.   Separate significant issues discussed: Anxiety and Depressed mood, Sleep disturbance -we discussed symptoms and concerns.  Unfortunately her Prozac is giving her some RLS problems.  We may consider stopping Prozac and working with a sleep aid for now or some other trial of medication to help with mood.  I strongly recommend she cut back on alcohol as any amount could be aggravating.  Mood and sleep  RLS -likely related to SSRI use.  Pending labs we will likely cut back on Prozac or change to different medication  Kelly Dean was seen today for fasitng cpe.  Diagnoses and all orders for this visit:  Encounter for health maintenance examination in adult -     Comprehensive metabolic panel -     CBC -     Lipid panel -     TSH -     Iron -     Vitamin B12  Vaccine counseling  Family history of hyperlipidemia -     Lipid panel  Mild intermittent asthma, unspecified whether complicated  RLS (restless legs syndrome) -     TSH -     Iron -     Vitamin B12  Sleep disturbance -     TSH -     Iron -     Vitamin B12    Follow-up pending labs, yearly for physical

## 2022-10-23 ENCOUNTER — Other Ambulatory Visit: Payer: Self-pay | Admitting: Medical

## 2022-10-23 LAB — COMPREHENSIVE METABOLIC PANEL
ALT: 9 IU/L (ref 0–32)
AST: 19 IU/L (ref 0–40)
Albumin/Globulin Ratio: 2 (ref 1.2–2.2)
Albumin: 4.2 g/dL (ref 4.0–5.0)
Alkaline Phosphatase: 39 IU/L — ABNORMAL LOW (ref 44–121)
BUN/Creatinine Ratio: 14 (ref 9–23)
BUN: 10 mg/dL (ref 6–20)
Bilirubin Total: 1 mg/dL (ref 0.0–1.2)
CO2: 20 mmol/L (ref 20–29)
Calcium: 9.3 mg/dL (ref 8.7–10.2)
Chloride: 105 mmol/L (ref 96–106)
Creatinine, Ser: 0.74 mg/dL (ref 0.57–1.00)
Globulin, Total: 2.1 g/dL (ref 1.5–4.5)
Glucose: 85 mg/dL (ref 70–99)
Potassium: 4.4 mmol/L (ref 3.5–5.2)
Sodium: 138 mmol/L (ref 134–144)
Total Protein: 6.3 g/dL (ref 6.0–8.5)
eGFR: 113 mL/min/{1.73_m2} (ref >59)

## 2022-10-23 LAB — CBC
Hematocrit: 41.4 % (ref 34.0–46.6)
Hemoglobin: 13.5 g/dL (ref 11.1–15.9)
MCH: 31.4 pg (ref 26.6–33.0)
MCHC: 32.6 g/dL (ref 31.5–35.7)
MCV: 96 fL (ref 79–97)
Platelets: 301 10*3/uL (ref 150–450)
RBC: 4.3 x10E6/uL (ref 3.77–5.28)
RDW: 11.4 % — ABNORMAL LOW (ref 11.7–15.4)
WBC: 5.2 10*3/uL (ref 3.4–10.8)

## 2022-10-23 LAB — LIPID PANEL
Chol/HDL Ratio: 2.8 ratio (ref 0.0–4.4)
Cholesterol, Total: 160 mg/dL (ref 100–199)
HDL: 57 mg/dL (ref >39)
LDL Chol Calc (NIH): 89 mg/dL (ref 0–99)
Triglycerides: 70 mg/dL (ref 0–149)
VLDL Cholesterol Cal: 14 mg/dL (ref 5–40)

## 2022-10-23 LAB — TSH: TSH: 0.88 u[IU]/mL (ref 0.450–4.500)

## 2022-10-23 LAB — IRON: Iron: 215 ug/dL — ABNORMAL HIGH (ref 27–159)

## 2022-10-23 LAB — VITAMIN B12: Vitamin B-12: 655 pg/mL (ref 232–1245)

## 2022-10-23 MED ORDER — CITALOPRAM HYDROBROMIDE 20 MG PO TABS: 20.0000 mg | ORAL_TABLET | Freq: Every day | ORAL | 1 refills | Status: DC

## 2022-10-27 ENCOUNTER — Telehealth: Payer: Self-pay

## 2022-10-27 ENCOUNTER — Other Ambulatory Visit: Payer: Self-pay | Admitting: Medical

## 2022-10-27 NOTE — Telephone Encounter (Signed)
Pt. Called to schedule her 3 month recheck of her Iron levels. I got her scheduled in Feb. If you could put the order in for that. Thanks.

## 2022-11-18 ENCOUNTER — Other Ambulatory Visit: Payer: Self-pay | Admitting: Medical

## 2022-11-18 NOTE — Telephone Encounter (Signed)
Pharmacy requesting 90 day refill last apt 10/22/22

## 2023-01-22 ENCOUNTER — Other Ambulatory Visit: Payer: Self-pay

## 2023-01-23 ENCOUNTER — Telehealth: Payer: Self-pay | Admitting: Medical

## 2023-01-23 ENCOUNTER — Other Ambulatory Visit: Payer: Self-pay | Admitting: Medical

## 2023-01-23 LAB — IRON,TIBC AND FERRITIN PANEL
Ferritin: 40 ng/mL (ref 15–150)
Iron Saturation: 56 % — ABNORMAL HIGH (ref 15–55)
Iron: 206 ug/dL — ABNORMAL HIGH (ref 27–159)
Total Iron Binding Capacity: 365 ug/dL (ref 250–450)
UIBC: 159 ug/dL (ref 131–425)

## 2023-01-23 NOTE — Progress Notes (Signed)
Interestingly the iron was still little elevated.  How much iron are you getting in your multivitamin or are you taking any other supplements that includes iron?  Do you eat a lot of meat?

## 2023-01-23 NOTE — Telephone Encounter (Signed)
Pt called to answer questions concerning lab. She states that she now takes a beet vitamin that has no iron in it. No of her supplements has any iron in them. She also states that she has stop eating red meat. She has probably eaten 1 steak and 1 hamburger in the last month. Pt can be reached at [phone].

## 2023-01-23 NOTE — Telephone Encounter (Signed)
See mychart message  

## 2023-01-26 ENCOUNTER — Other Ambulatory Visit: Payer: 59

## 2023-02-02 LAB — HEMOCHROMATOSIS DNA-PCR(C282Y,H63D)

## 2023-02-02 NOTE — Progress Notes (Signed)
Dr. Benay Spice, I have a question for you. I have this young lady with elevated iron multiple times with no good reason.   I did a hemachromatosis gene test to further eval.    Please see attached results. I haven't done a lot of these, and under clinical key they mainly reference other alleles.  How would you interpret this?  I am getting the impression that this variant would NOT require treatment or further eval.  I may be wrong though.   Is this a patient that you would want to see? Or other thoughts about the iron.  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

## 2023-02-02 NOTE — Progress Notes (Signed)
Results sent through MyChart

## 2023-02-04 NOTE — Progress Notes (Signed)
I heard back from hematology specialist (Dr. Benay Spice), and thankfully they reassured this is not worrisome for hemochromatosis and your iron levels in their opinion are fine and nothing to worry about.  This is good news

## 2023-03-24 DIAGNOSIS — Z6820 Body mass index (BMI) 20.0-20.9, adult: Secondary | ICD-10-CM | POA: Diagnosis not present

## 2023-03-24 DIAGNOSIS — Z3041 Encounter for surveillance of contraceptive pills: Secondary | ICD-10-CM | POA: Diagnosis not present

## 2023-03-24 DIAGNOSIS — Z01419 Encounter for gynecological examination (general) (routine) without abnormal findings: Secondary | ICD-10-CM | POA: Diagnosis not present

## 2023-05-04 DIAGNOSIS — M545 Low back pain, unspecified: Secondary | ICD-10-CM | POA: Diagnosis not present

## 2023-05-25 DIAGNOSIS — M545 Low back pain, unspecified: Secondary | ICD-10-CM | POA: Diagnosis not present

## 2023-06-02 DIAGNOSIS — M545 Low back pain, unspecified: Secondary | ICD-10-CM | POA: Diagnosis not present

## 2023-06-17 DIAGNOSIS — M545 Low back pain, unspecified: Secondary | ICD-10-CM | POA: Diagnosis not present

## 2023-07-08 DIAGNOSIS — M545 Low back pain, unspecified: Secondary | ICD-10-CM | POA: Diagnosis not present

## 2023-07-10 ENCOUNTER — Other Ambulatory Visit: Payer: Self-pay | Admitting: Oncology

## 2023-07-10 DIAGNOSIS — Z006 Encounter for examination for normal comparison and control in clinical research program: Secondary | ICD-10-CM

## 2023-07-27 ENCOUNTER — Other Ambulatory Visit: Payer: Self-pay | Admitting: Medical

## 2023-07-28 ENCOUNTER — Other Ambulatory Visit (HOSPITAL_COMMUNITY)
Admission: RE | Admit: 2023-07-28 | Discharge: 2023-07-28 | Disposition: A | Discharge status: Home / Self Care | Payer: Self-pay | Source: Ambulatory Visit | Attending: Oncology | Admitting: Oncology

## 2023-07-28 DIAGNOSIS — Z006 Encounter for examination for normal comparison and control in clinical research program: Secondary | ICD-10-CM | POA: Insufficient documentation

## 2023-10-13 LAB — HELIX MOLECULAR SCREEN: Genetic Analysis Overall Interpretation: NEGATIVE

## 2023-10-27 ENCOUNTER — Encounter: Payer: Self-pay | Admitting: Medical

## 2023-10-27 ENCOUNTER — Ambulatory Visit (INDEPENDENT_AMBULATORY_CARE_PROVIDER_SITE_OTHER): Payer: 59 | Admitting: Medical

## 2023-10-27 VITALS — BP 110/60 | HR 72 | Ht 60.0 in | Wt 114.6 lb

## 2023-10-27 DIAGNOSIS — Z0000 Encounter for general adult medical examination without abnormal findings: Secondary | ICD-10-CM

## 2023-10-27 DIAGNOSIS — F419 Anxiety disorder, unspecified: Secondary | ICD-10-CM | POA: Diagnosis not present

## 2023-10-27 DIAGNOSIS — Z83438 Family history of other disorder of lipoprotein metabolism and other lipidemia: Secondary | ICD-10-CM | POA: Diagnosis not present

## 2023-10-27 DIAGNOSIS — Z13220 Encounter for screening for lipoid disorders: Secondary | ICD-10-CM

## 2023-10-27 DIAGNOSIS — F32A Depression, unspecified: Secondary | ICD-10-CM | POA: Diagnosis not present

## 2023-10-27 MED ORDER — CITALOPRAM HYDROBROMIDE 20 MG PO TABS: 20.0000 mg | ORAL_TABLET | Freq: Every day | ORAL | 2 refills | Status: DC

## 2023-10-27 MED ORDER — HYDROXYZINE HCL 10 MG PO TABS: 10.0000 mg | ORAL_TABLET | Freq: Two times a day (BID) | ORAL | 1 refills | Status: DC

## 2023-10-27 NOTE — Progress Notes (Signed)
Subjective:   HPI  Kelly Dean is a 27 y.o. female who presents for Chief Complaint  Patient presents with   Annual Exam    Fasting annual exam, sees Dr. Renaldo Fiddler for GYN and UTD. Would like iron panel checked with her labs as she states she has gene for hemachromatosis. Would like to discuss possibly increasing citalopram. Could not give urine today. [id number] and [id number]    Patient Care Team: Elah Avellino, Kermit Balo, PA-C as PCP - General (Family Medicine) Zelphia Cairo, MD as Consulting Physician (Obstetrics and Gynecology) Sees dentist Physical therapy for scoliosis   Concerns: Asthma - no recent issues.  Last used inhaler years ago  Been living with boyfriend since February.    Exercising regularly, body building program with [employer]  She wants iron rechecked given finding last year  Periods are regular, not heavy  Reviewed their medical, surgical, family, social, medication, and allergy history and updated chart as appropriate.  Past Medical History:  Diagnosis Date   Allergy    Asthma 05/2015   exercise induced, mild intermittent   Contraception management    [facility], [facility]   Eczema    Encounter for routine gynecological examination    sees Dr. Renaldo Fiddler, [facility]    Family History  Problem Relation Age of Onset   Migraines Mother    Hyperlipidemia Mother    Hypertension Father    Hyperlipidemia Father    Aneurysm Father        inguinal   Cancer Paternal Aunt        breast   Cancer Paternal Uncle        prostate   AAA (abdominal aortic aneurysm) Paternal Uncle    Heart disease Maternal Grandmother        surgery for PDA age 25, and aortic valve replacement at 1   Cancer Maternal Grandmother        liposarcoma   Hearing loss Maternal Grandmother    Arthritis Paternal Grandmother    Asthma Paternal Grandmother    Hyperlipidemia Paternal Grandmother    Hypertension Paternal Grandmother    Obesity Paternal Grandmother     Heart disease Paternal Grandfather 67   Hyperlipidemia Paternal Grandfather    Hypertension Paternal Grandfather    Stroke Neg Hx    Diabetes Neg Hx      Current Outpatient Medications:    FIBER PO, Take 2 tablets by mouth daily., Disp: , Rfl:    hydrOXYzine (ATARAX) 10 MG tablet, Take 1 tablet (10 mg total) by mouth in the morning and at bedtime., Disp: 60 tablet, Rfl: 1   levonorgestrel-ethinyl estradiol (NORDETTE) 0.15-30 MG-MCG tablet, Take 1 tablet by mouth daily., Disp: , Rfl:    Multiple Vitamins-Minerals (MULTIVITAMIN WITH MINERALS) tablet, Take 1 tablet by mouth daily., Disp: , Rfl:    citalopram (CELEXA) 20 MG tablet, Take 1 tablet (20 mg total) by mouth daily., Disp: 30 tablet, Rfl: 2   valACYclovir (VALTREX) 500 MG tablet, Take 1 tablet (500 mg total) by mouth 2 (two) times daily as directed (Patient not taking: Reported on 10/27/2023), Disp: 30 tablet, Rfl: 6  Allergies  Allergen Reactions   Penicillins Rash and Hives   Lactose    Review of Systems  Constitutional:  Negative for chills, fever, malaise/fatigue and weight loss.  HENT:  Negative for congestion, ear pain, hearing loss, sore throat and tinnitus.   Eyes:  Negative for blurred vision, pain and redness.  Respiratory:  Negative for  cough, hemoptysis and shortness of breath.   Cardiovascular:  Negative for chest pain, palpitations, orthopnea, claudication and leg swelling.  Gastrointestinal:  Negative for abdominal pain, blood in stool, constipation, diarrhea, nausea and vomiting.  Genitourinary:  Negative for dysuria, flank pain, frequency, hematuria and urgency.  Musculoskeletal:  Negative for falls, joint pain and myalgias.  Skin:  Negative for itching and rash.  Neurological:  Negative for dizziness, tingling, speech change, weakness and headaches.  Endo/Heme/Allergies:  Negative for polydipsia. Does not bruise/bleed easily.  Psychiatric/Behavioral:  Negative for depression and memory loss. The patient is  nervous/anxious. The patient does not have insomnia.        10/27/2023    8:22 AM 09/02/2022    3:16 PM 01/23/2022   10:56 AM 10/16/2021    9:05 AM 10/03/2020    8:48 AM  Depression screen PHQ 2/9  Decreased Interest 0 3 0 0 0  Down, Depressed, Hopeless 0 3 0 0 0  PHQ - 2 Score 0 6 0 0 0  Altered sleeping 3 3   0  Tired, decreased energy 0 3   0  Change in appetite 0 1   0  Feeling bad or failure about yourself  0 1   0  Trouble concentrating 3 3   0  Moving slowly or fidgety/restless 0 3   0  Suicidal thoughts 0 0   0  PHQ-9 Score 6 20   0  Difficult doing work/chores Not difficult at all Not difficult at all          Objective:  BP 110/60   Pulse 72   Ht 5' (1.524 m)   Wt 114 lb 9.6 oz (52 kg)   LMP 10/27/2023 (Exact Date)   SpO2 98%   BMI 22.38 kg/m   Wt Readings from Last 3 Encounters:  10/27/23 114 lb 9.6 oz (52 kg)  10/22/22 103 lb (46.7 kg)  10/02/22 105 lb (47.6 kg)    General appearance: alert, no distress, WD/WN, Caucasian female Skin: unremarkable HEENT: normocephalic, conjunctiva/corneas normal, sclerae anicteric, PERRLA, EOMi, nares patent, no discharge or erythema, pharynx normal Oral cavity: MMM, tongue normal, teeth normal Neck: supple, no lymphadenopathy, no thyromegaly, no masses, normal ROM, no bruits Chest: non tender, normal shape and expansion Heart: RRR, normal S1, S2, no murmurs Lungs: CTA bilaterally, no wheezes, rhonchi, or rales Abdomen: +bs, soft, non tender, non distended, no masses, no hepatomegaly, no splenomegaly, no bruits Back: non tender, normal ROM, no scoliosis Musculoskeletal: upper extremities non tender, no obvious deformity, normal ROM throughout, lower extremities non tender, no obvious deformity, normal ROM throughout Extremities: no edema, no cyanosis, no clubbing Pulses: 2+ symmetric, upper and lower extremities, normal cap refill Neurological: alert, oriented x 3, CN2-12 intact, strength normal upper extremities and  lower extremities, sensation normal throughout, DTRs 2+ throughout, no cerebellar signs, gait normal Psychiatric: normal affect, behavior normal, pleasant  Breast/gyn/rectal - deferred to gynecology     Assessment and Plan :   Encounter Diagnoses  Name Primary?   Encounter for health maintenance examination in adult Yes   Screening for lipid disorders    Anxiety and depression    Family history of hyperlipidemia    Iron excess       This visit was a preventative care visit, also known as wellness visit or routine physical.   Topics typically include healthy lifestyle, diet, exercise, preventative care, vaccinations, sick and well care, proper use of emergency dept and after hours care, as well  as other concerns.     Recommendations: Continue to return yearly for your annual wellness and preventative care visits.  This gives [place] a chance to discuss healthy lifestyle, exercise, vaccinations, review your chart record, and perform screenings where appropriate.  I recommend you see your eye doctor yearly for routine vision care.  I recommend you see your dentist yearly for routine dental care including hygiene visits twice yearly.   Vaccination recommendations were reviewed Immunization History  Administered Date(s) Administered   DTaP 06/10/1994, 08/11/1994, 10/14/1994, 07/09/1995, 06/13/1998   HIB (PRP-OMP) 06/10/1994, 08/11/1994, 10/14/1994, 04/09/1995   HPV 9-valent 08/25/2019, 10/26/2019, 02/22/2020   Hepatitis A, Adult 05/29/2014, 06/05/2015   Hepatitis B 05/08/1994, 10/14/1994, 07/09/1995, 06/13/1998   IPV 06/10/1994, 08/11/1994, 10/14/1994, 06/13/1998   Influenza Split 10/23/2007, 09/05/2011, 08/19/2015, 09/13/2021   Influenza Whole 10/31/2006, 10/01/2009, 10/11/2010   Influenza,inj,Quad PF,6+ Mos 10/03/2020   Influenza-Unspecified 09/13/2021, 10/06/2022, 09/11/2023   MMR 05/01/1995, 06/13/1998   Meningococcal Conjugate 12/22/2006, 07/08/2011   PFIZER(Purple  Top)SARS-COV-2 Vaccination 12/13/2019, 01/04/2020, 10/17/2020   PPD Test 05/26/2014, 06/05/2014, 05/15/2015   Tdap 07/08/2011, 10/16/2021   Varicella 07/09/1995, 07/08/2011    Screening for cancer: Colon cancer screening: Age 62  Breast cancer screening: You should perform a self breast exam monthly.   We reviewed recommendations for regular mammograms and breast cancer screening.  Cervical cancer screening: We reviewed recommendations for pap smear screening.   Skin cancer screening: Check your skin regularly for new changes, growing lesions, or other lesions of concern Come in for evaluation if you have skin lesions of concern.  Lung cancer screening: If you have a greater than 20 pack year history of tobacco use, then you may qualify for lung cancer screening with a chest CT scan.   Please call your insurance company to inquire about coverage for this test.  We currently don't have screenings for other cancers besides breast, cervical, colon, and lung cancers.  If you have a strong family history of cancer or have other cancer screening concerns, please let me know.    Bone health: Get at least 150 minutes of aerobic exercise weekly Get weight bearing exercise at least once weekly Bone density test:  A bone density test is an imaging test that uses a type of X-ray to measure the amount of calcium and other minerals in your bones. The test may be used to diagnose or screen you for a condition that causes weak or thin bones (osteoporosis), predict your risk for a broken bone (fracture), or determine how well your osteoporosis treatment is working. The bone density test is recommended for females 65 and older, or females or males <65 if certain risk factors such as thyroid disease, long term use of steroids such as for asthma or rheumatological issues, vitamin D deficiency, estrogen deficiency, family history of osteoporosis, self or family history of fragility fracture in first  degree relative.    Heart health: Get at least 150 minutes of aerobic exercise weekly Limit alcohol It is important to maintain a healthy blood pressure and healthy cholesterol numbers  Heart disease screening: Screening for heart disease includes screening for blood pressure, fasting lipids, glucose/diabetes screening, BMI height to weight ratio, reviewed of smoking status, physical activity, and diet.    Goals include blood pressure 120/80 or less, maintaining a healthy lipid/cholesterol profile, preventing diabetes or keeping diabetes numbers under good control, not smoking or using tobacco products, exercising most days per week or at least 150 minutes per week of exercise, and eating healthy  variety of fruits and vegetables, healthy oils, and avoiding unhealthy food choices like fried food, fast food, high sugar and high cholesterol foods.    Other tests may possibly include EKG test, CT coronary calcium score, echocardiogram, exercise treadmill stress test.    Medical care options: I recommend you continue to seek care here first for routine care.  We try really hard to have available appointments Monday through Friday daytime hours for sick visits, acute visits, and physicals.  Urgent care should be used for after hours and weekends for significant issues that cannot wait till the next day.  The emergency department should be used for significant potentially life-threatening emergencies.  The emergency department is expensive, can often have long wait times for less significant concerns, so try to utilize primary care, urgent care, or telemedicine when possible to avoid unnecessary trips to the emergency department.  Virtual visits and telemedicine have been introduced since the pandemic started in 2020, and can be convenient ways to receive medical care.  We offer virtual appointments as well to assist you in a variety of options to seek medical care.   Separate significant issues  discussed: Anxiety and Depressed - continue citalopram but add hydroxyzine bid . Discussed risk/benefits of medication.  Consider going back to counseling.    Advised she cut back on alcohol.  Iron excess - reviewed labs and hemochromatosis labs from last year.  Update iron level today  [employer]" was seen today for annual exam.  Diagnoses and all orders for this visit:  Encounter for health maintenance examination in adult -     Comprehensive metabolic panel -     CBC with Differential/Platelet -     Iron, TIBC and Ferritin Panel -     Lipid panel  Screening for lipid disorders -     Lipid panel  Anxiety and depression  Family history of hyperlipidemia  Iron excess -     CBC with Differential/Platelet -     Iron, TIBC and Ferritin Panel  Other orders -     citalopram (CELEXA) 20 MG tablet; Take 1 tablet (20 mg total) by mouth daily. -     hydrOXYzine (ATARAX) 10 MG tablet; Take 1 tablet (10 mg total) by mouth in the morning and at bedtime.     Follow-up pending labs, yearly for physical

## 2023-10-28 LAB — LIPID PANEL
Chol/HDL Ratio: 2.5 ratio (ref 0.0–4.4)
Cholesterol, Total: 184 mg/dL (ref 100–199)
HDL: 75 mg/dL (ref >39)
LDL Chol Calc (NIH): 97 mg/dL (ref 0–99)
Triglycerides: 64 mg/dL (ref 0–149)
VLDL Cholesterol Cal: 12 mg/dL (ref 5–40)

## 2023-10-28 LAB — COMPREHENSIVE METABOLIC PANEL
ALT: 26 IU/L (ref 0–32)
AST: 43 [IU]/L — ABNORMAL HIGH (ref 0–40)
Albumin: 4.1 g/dL (ref 4.0–5.0)
Alkaline Phosphatase: 48 [IU]/L (ref 44–121)
BUN/Creatinine Ratio: 14 (ref 9–23)
BUN: 12 mg/dL (ref 6–20)
Bilirubin Total: 0.9 mg/dL (ref 0.0–1.2)
CO2: 21 mmol/L (ref 20–29)
Calcium: 9.1 mg/dL (ref 8.7–10.2)
Chloride: 106 mmol/L (ref 96–106)
Creatinine, Ser: 0.86 mg/dL (ref 0.57–1.00)
Globulin, Total: 2 g/dL (ref 1.5–4.5)
Glucose: 87 mg/dL (ref 70–99)
Potassium: 4.6 mmol/L (ref 3.5–5.2)
Sodium: 141 mmol/L (ref 134–144)
Total Protein: 6.1 g/dL (ref 6.0–8.5)
eGFR: 94 mL/min/{1.73_m2} (ref >59)

## 2023-10-28 LAB — CBC WITH DIFFERENTIAL/PLATELET
Basophils Absolute: 0.1 10*3/uL (ref 0.0–0.2)
Basos: 1 %
EOS (ABSOLUTE): 0.3 10*3/uL (ref 0.0–0.4)
Eos: 6 %
Hematocrit: 41.5 % (ref 34.0–46.6)
Hemoglobin: 13.5 g/dL (ref 11.1–15.9)
Immature Grans (Abs): 0 10*3/uL (ref 0.0–0.1)
Immature Granulocytes: 0 %
Lymphocytes Absolute: 1.9 10*3/uL (ref 0.7–3.1)
Lymphs: 42 %
MCH: 32.5 pg (ref 26.6–33.0)
MCHC: 32.5 g/dL (ref 31.5–35.7)
MCV: 100 fL — ABNORMAL HIGH (ref 79–97)
Monocytes Absolute: 0.3 10*3/uL (ref 0.1–0.9)
Monocytes: 6 %
Neutrophils Absolute: 2 10*3/uL (ref 1.4–7.0)
Neutrophils: 45 %
Platelets: 271 10*3/uL (ref 150–450)
RBC: 4.16 x10E6/uL (ref 3.77–5.28)
RDW: 11.3 % — ABNORMAL LOW (ref 11.7–15.4)
WBC: 4.5 10*3/uL (ref 3.4–10.8)

## 2023-10-28 LAB — IRON,TIBC AND FERRITIN PANEL
Ferritin: 45 ng/mL (ref 15–150)
Iron Saturation: 63 % — ABNORMAL HIGH (ref 15–55)
Iron: 207 ug/dL — ABNORMAL HIGH (ref 27–159)
Total Iron Binding Capacity: 330 ug/dL (ref 250–450)
UIBC: 123 ug/dL — ABNORMAL LOW (ref 131–425)

## 2023-10-28 NOTE — Progress Notes (Signed)
Schedule 3 month follow-up Results sent to MyChart

## 2023-11-09 ENCOUNTER — Ambulatory Visit: Payer: Self-pay | Admitting: Cardiovascular Disease

## 2023-11-18 ENCOUNTER — Other Ambulatory Visit: Payer: Self-pay | Admitting: Medical

## 2023-12-18 ENCOUNTER — Other Ambulatory Visit: Payer: Self-pay | Admitting: Family

## 2023-12-21 ENCOUNTER — Inpatient Hospital Stay: Payer: Self-pay | Attending: Hematology & Oncology

## 2023-12-21 ENCOUNTER — Encounter: Payer: Self-pay | Admitting: Family

## 2023-12-21 ENCOUNTER — Inpatient Hospital Stay: Payer: Self-pay | Admitting: Family

## 2023-12-21 ENCOUNTER — Other Ambulatory Visit: Payer: Self-pay

## 2023-12-21 DIAGNOSIS — Z79899 Other long term (current) drug therapy: Secondary | ICD-10-CM | POA: Insufficient documentation

## 2023-12-21 LAB — CMP (CANCER CENTER ONLY)
ALT: 17 U/L (ref 0–44)
AST: 33 U/L (ref 15–41)
Albumin: 4.2 g/dL (ref 3.5–5.0)
Alkaline Phosphatase: 36 U/L — ABNORMAL LOW (ref 38–126)
Anion gap: 7 (ref 5–15)
BUN: 15 mg/dL (ref 6–20)
CO2: 27 mmol/L (ref 22–32)
Calcium: 8.8 mg/dL — ABNORMAL LOW (ref 8.9–10.3)
Chloride: 107 mmol/L (ref 98–111)
Creatinine: 0.93 mg/dL (ref 0.44–1.00)
GFR, Estimated: >60 mL/min (ref >60)
Glucose, Bld: 87 mg/dL (ref 70–99)
Potassium: 4.1 mmol/L (ref 3.5–5.1)
Sodium: 141 mmol/L (ref 135–145)
Total Bilirubin: 0.9 mg/dL (ref 0.0–1.2)
Total Protein: 6.2 g/dL — ABNORMAL LOW (ref 6.5–8.1)

## 2023-12-21 LAB — FERRITIN: Ferritin: 13 ng/mL (ref 11–307)

## 2023-12-21 LAB — CBC WITH DIFFERENTIAL (CANCER CENTER ONLY)
Abs Immature Granulocytes: 0.01 10*3/uL (ref 0.00–0.07)
Basophils Absolute: 0.1 10*3/uL (ref 0.0–0.1)
Basophils Relative: 1 %
Eosinophils Absolute: 0.3 10*3/uL (ref 0.0–0.5)
Eosinophils Relative: 5 %
HCT: 38 % (ref 36.0–46.0)
Hemoglobin: 12.5 g/dL (ref 12.0–15.0)
Immature Granulocytes: 0 %
Lymphocytes Relative: 58 %
Lymphs Abs: 3.3 10*3/uL (ref 0.7–4.0)
MCH: 31.6 pg (ref 26.0–34.0)
MCHC: 32.9 g/dL (ref 30.0–36.0)
MCV: 96.2 fL (ref 80.0–100.0)
Monocytes Absolute: 0.4 10*3/uL (ref 0.1–1.0)
Monocytes Relative: 7 %
Neutro Abs: 1.6 10*3/uL — ABNORMAL LOW (ref 1.7–7.7)
Neutrophils Relative %: 29 %
Platelet Count: 349 10*3/uL (ref 150–400)
RBC: 3.95 MIL/uL (ref 3.87–5.11)
RDW: 11.9 % (ref 11.5–15.5)
WBC Count: 5.6 10*3/uL (ref 4.0–10.5)
nRBC: 0 % (ref 0.0–0.2)

## 2023-12-21 LAB — RETICULOCYTES
Immature Retic Fract: 5.9 % (ref 2.3–15.9)
RBC.: 3.91 MIL/uL (ref 3.87–5.11)
Retic Count, Absolute: 45 10*3/uL (ref 19.0–186.0)
Retic Ct Pct: 1.2 % (ref 0.4–3.1)

## 2023-12-21 LAB — IRON AND IRON BINDING CAPACITY (CC-WL,HP ONLY)
Iron: 164 ug/dL (ref 28–170)
Saturation Ratios: 40 % — ABNORMAL HIGH (ref 10.4–31.8)
TIBC: 414 ug/dL (ref 250–450)
UIBC: 250 ug/dL (ref 148–442)

## 2023-12-21 LAB — LACTATE DEHYDROGENASE: LDH: 167 U/L (ref 98–192)

## 2023-12-21 NOTE — Progress Notes (Signed)
 Hematology/Oncology Consultation   Name: Kelly Dean      MRN: [id number]    Location: Room/bed info not found  Date: 12/21/2023 Time:9:33 AM   REFERRING PHYSICIAN: Alm Gent, PA-C  REASON FOR CONSULT: Iron overload   DIAGNOSIS: Hemochromatosis, heterozygous for the H63D mutation  HISTORY OF PRESENT ILLNESS: Kelly Dean is a very pleasant 28 yo caucasian female with hemochromatosis and elevated iron saturation.  She was diagnosed back in February 2024 and has been donating blood with the [employer].  She most recently donated a week or so ago. Iron saturation is down from 63% to 40%. Ferritin was 45 and today's result is pending.  She noted constipation until donating blood then it corrected.  Her maternal grandfather passed away in his 56's from NASH.  Patient's LFT today look good.  Patient has no known liver issues.  Spleen is still in.  Cycle is regular on birth control with normal flow. No other blood loss noted. No abnormal bruising, no petechiae.  She has never been pregnant.  Patient is active going to the gym regularly.  No smoking or recreational drug use.  ETOH socially.  She takes Atarax  for sleep.  No history of diabetes or thyroid disease.  No personal history of cancer. Maternal grandmother had liposarcoma, maternal aunt had colon, maternal uncle had bone and paternal aunt had breast.  She has never required an EGD, colonoscopy or mammogram.   No fever, chills, n/v, cough, rash, dizziness, changes in/loss of vision, SOB, chest pain, palpitations, abdominal pain/bloating or changes in bowel or bladder habits.  No hot flashes or night sweats.  No swelling, tenderness, numbness or tingling in her extremities. No falls or syncope reported.  Appetite and hydration are good. Weight is stable at 111 lbs.   ROS: All other 10 point review of systems is negative.   PAST MEDICAL HISTORY:   Past Medical History:  Diagnosis Date   Allergy    Asthma 05/2015   exercise  induced, mild intermittent   Contraception management    [facility]   Eczema    Encounter for routine gynecological examination    sees Dr. Latisha, [facility]    ALLERGIES: Allergies  Allergen Reactions   Penicillins Rash and Hives   Lactose       MEDICATIONS:  Current Outpatient Medications on File Prior to Visit  Medication Sig Dispense Refill   citalopram  (CELEXA ) 20 MG tablet Take 1 tablet (20 mg total) by mouth daily. 30 tablet 2   FIBER PO Take 2 tablets by mouth daily.     hydrOXYzine  (ATARAX ) 10 MG tablet TAKE 1 TABLET (10 MG TOTAL) BY MOUTH IN THE MORNING AND AT BEDTIME 180 tablet 0   levonorgestrel-ethinyl estradiol (NORDETTE) 0.15-30 MG-MCG tablet Take 1 tablet by mouth daily.     Multiple Vitamins-Minerals (MULTIVITAMIN WITH MINERALS) tablet Take 1 tablet by mouth daily.     valACYclovir  (VALTREX ) 500 MG tablet Take 1 tablet (500 mg total) by mouth 2 (two) times daily as directed (Patient not taking: Reported on 10/27/2023) 30 tablet 6   No current facility-administered medications on file prior to visit.     PAST SURGICAL HISTORY Past Surgical History:  Procedure Laterality Date   GUM SURGERY     SCLEROTHERAPY  2019   vein therapy for varicosities/spider veins   WISDOM TOOTH EXTRACTION  2017    FAMILY HISTORY: Family History  Problem Relation Age of Onset   Migraines Mother  Hyperlipidemia Mother    Hypertension Father    Hyperlipidemia Father    Aneurysm Father        inguinal   Cancer Paternal Aunt        breast   Cancer Paternal Uncle        prostate   AAA (abdominal aortic aneurysm) Paternal Uncle    Heart disease Maternal Grandmother        surgery for PDA age 52, and aortic valve replacement at 61   Cancer Maternal Grandmother        liposarcoma   Hearing loss Maternal Grandmother    Arthritis Paternal Grandmother    Asthma Paternal Grandmother    Hyperlipidemia Paternal Grandmother    Hypertension  Paternal Grandmother    Obesity Paternal Grandmother    Heart disease Paternal Grandfather 11   Hyperlipidemia Paternal Grandfather    Hypertension Paternal Grandfather    Stroke Neg Hx    Diabetes Neg Hx     SOCIAL HISTORY:  reports that she has never smoked. She has never used smokeless tobacco. She reports current alcohol use of about 20.0 standard drinks of alcohol per week. She reports that she does not use drugs.  PERFORMANCE STATUS: The patient's performance status is 1 - Symptomatic but completely ambulatory  PHYSICAL EXAM: Most Recent Vital Signs: There were no vitals taken for this visit. BP (!) 93/59 (BP Location: Left Arm, Patient Position: Sitting)   Pulse 79   Temp 97.9 F (36.6 C) (Oral)   Resp 16   Ht 5' (1.524 m)   Wt 111 lb 1.9 oz (50.4 kg)   SpO2 100%   BMI 21.70 kg/m   General Appearance:    Alert, cooperative, no distress, appears stated age  Head:    Normocephalic, without obvious abnormality, atraumatic  Eyes:    PERRL, conjunctiva/corneas clear, EOM's intact, fundi    benign, both eyes        Throat:   Lips, mucosa, and tongue normal; teeth and gums normal  Neck:   Supple, symmetrical, trachea midline, no adenopathy;    thyroid:  no enlargement/tenderness/nodules; no carotid   bruit or JVD  Back:     Symmetric, no curvature, ROM normal, no CVA tenderness  Lungs:     Clear to auscultation bilaterally, respirations unlabored  Chest Wall:    No tenderness or deformity   Heart:    Regular rate and rhythm, S1 and S2 normal, no murmur, rub   or gallop     Abdomen:     Soft, non-tender, bowel sounds active all four quadrants,    no masses, no organomegaly        Extremities:   Extremities normal, atraumatic, no cyanosis or edema  Pulses:   2+ and symmetric all extremities  Skin:   Skin color, texture, turgor normal, no rashes or lesions  Lymph nodes:   Cervical, supraclavicular, and axillary nodes normal  Neurologic:   CNII-XII intact, normal  strength, sensation and reflexes    throughout    LABORATORY DATA:  Results for orders placed or performed in visit on 12/21/23 (from the past 48 hours)  CBC with Differential (Cancer Center Only)     Status: Abnormal   Collection Time: 12/21/23  8:58 AM  Result Value Ref Range   WBC Count 5.6 4.0 - 10.5 K/uL   RBC 3.95 3.87 - 5.11 MIL/uL   Hemoglobin 12.5 12.0 - 15.0 g/dL   HCT 61.9 63.9 - 53.9 %   MCV 96.2  80.0 - 100.0 fL   MCH 31.6 26.0 - 34.0 pg   MCHC 32.9 30.0 - 36.0 g/dL   RDW 88.0 88.4 - 84.4 %   Platelet Count 349 150 - 400 K/uL   nRBC 0.0 0.0 - 0.2 %   Neutrophils Relative % 29 %   Neutro Abs 1.6 (L) 1.7 - 7.7 K/uL   Lymphocytes Relative 58 %   Lymphs Abs 3.3 0.7 - 4.0 K/uL   Monocytes Relative 7 %   Monocytes Absolute 0.4 0.1 - 1.0 K/uL   Eosinophils Relative 5 %   Eosinophils Absolute 0.3 0.0 - 0.5 K/uL   Basophils Relative 1 %   Basophils Absolute 0.1 0.0 - 0.1 K/uL   Immature Granulocytes 0 %   Abs Immature Granulocytes 0.01 0.00 - 0.07 K/uL    Comment: Performed at [facility] at [facility], [address] [place], [place] [address]  Reticulocytes     Status: None   Collection Time: 12/21/23  8:58 AM  Result Value Ref Range   Retic Ct Pct 1.2 0.4 - 3.1 %   RBC. 3.91 3.87 - 5.11 MIL/uL   Retic Count, Absolute 45.0 19.0 - 186.0 K/uL   Immature Retic Fract 5.9 2.3 - 15.9 %    Comment: Performed at [facility] at [facility], [address] [place], [place] [address]      RADIOGRAPHY: No results found.     PATHOLOGY: None  ASSESSMENT/PLAN: Ms. Sween is a very pleasant 28 yo caucasian female with hemochromatosis, heterozygous for the H63D mutation.  Iron saturation after donation last week is 40% and ferritin 13.  Follow-up in 3 months with lab check.   All questions were answered. The patient knows to call the clinic with any problems, questions or concerns. We can certainly see the patient much  sooner if necessary.  The patient was discussed with Dr. Timmy and he is in agreement with the aforementioned.   Lauraine Pepper, NP

## 2023-12-23 ENCOUNTER — Encounter: Payer: Self-pay | Admitting: Family

## 2023-12-25 ENCOUNTER — Telehealth: Payer: Self-pay

## 2023-12-25 NOTE — Telephone Encounter (Signed)
 Pt is supposed to come in for a 3 month check on iron. She is seeing a hematologist already. Do you still want her to come in? Provider notes in chart  Hematologist diagnosed with hemochromatosis and wants that updated in her chart.

## 2023-12-28 ENCOUNTER — Encounter: Payer: Self-pay | Admitting: Internal Medicine

## 2023-12-28 DIAGNOSIS — F32A Depression, unspecified: Secondary | ICD-10-CM | POA: Insufficient documentation

## 2023-12-28 DIAGNOSIS — F419 Anxiety disorder, unspecified: Secondary | ICD-10-CM | POA: Insufficient documentation

## 2023-12-28 NOTE — Telephone Encounter (Signed)
 Called pt, no answer, left voicemail.

## 2023-12-28 NOTE — Telephone Encounter (Signed)
This has already been added.

## 2024-02-17 ENCOUNTER — Telehealth: Payer: Self-pay | Admitting: *Deleted

## 2024-02-17 DIAGNOSIS — R059 Cough, unspecified: Secondary | ICD-10-CM | POA: Diagnosis not present

## 2024-02-17 DIAGNOSIS — Z20822 Contact with and (suspected) exposure to covid-19: Secondary | ICD-10-CM | POA: Diagnosis not present

## 2024-02-17 DIAGNOSIS — R071 Chest pain on breathing: Secondary | ICD-10-CM | POA: Diagnosis not present

## 2024-02-17 NOTE — Telephone Encounter (Signed)
 Patient called stating that she has RUQ dull pain and tenderness with fever of 100.  Exacerbated when she bends over, moves or breathes in deep.  Eileen Stanford NP notified.  Wants patient to go to Urgent Care to be evaluated.  Last Liver function tests were normal and the fever is important to be checked out.

## 2024-02-18 ENCOUNTER — Other Ambulatory Visit: Payer: Self-pay | Admitting: Medical

## 2024-03-12 ENCOUNTER — Other Ambulatory Visit: Payer: Self-pay | Admitting: Medical

## 2024-03-21 ENCOUNTER — Inpatient Hospital Stay: Payer: Self-pay | Attending: Hematology & Oncology

## 2024-03-21 ENCOUNTER — Inpatient Hospital Stay (HOSPITAL_BASED_OUTPATIENT_CLINIC_OR_DEPARTMENT_OTHER): Payer: Self-pay | Admitting: Family

## 2024-03-21 LAB — CBC WITH DIFFERENTIAL (CANCER CENTER ONLY)
Abs Immature Granulocytes: 0.01 10*3/uL (ref 0.00–0.07)
Basophils Absolute: 0.1 10*3/uL (ref 0.0–0.1)
Basophils Relative: 1 %
Eosinophils Absolute: 0.3 10*3/uL (ref 0.0–0.5)
Eosinophils Relative: 5 %
HCT: 39.1 % (ref 36.0–46.0)
Hemoglobin: 13.3 g/dL (ref 12.0–15.0)
Immature Granulocytes: 0 %
Lymphocytes Relative: 44 %
Lymphs Abs: 2.6 10*3/uL (ref 0.7–4.0)
MCH: 31.1 pg (ref 26.0–34.0)
MCHC: 34 g/dL (ref 30.0–36.0)
MCV: 91.6 fL (ref 80.0–100.0)
Monocytes Absolute: 0.4 10*3/uL (ref 0.1–1.0)
Monocytes Relative: 6 %
Neutro Abs: 2.7 10*3/uL (ref 1.7–7.7)
Neutrophils Relative %: 44 %
Platelet Count: 263 10*3/uL (ref 150–400)
RBC: 4.27 MIL/uL (ref 3.87–5.11)
RDW: 13.2 % (ref 11.5–15.5)
WBC Count: 6 10*3/uL (ref 4.0–10.5)
nRBC: 0 % (ref 0.0–0.2)

## 2024-03-21 LAB — CMP (CANCER CENTER ONLY)
ALT: 17 U/L (ref 0–44)
AST: 40 U/L (ref 15–41)
Albumin: 4.5 g/dL (ref 3.5–5.0)
Alkaline Phosphatase: 49 U/L (ref 38–126)
Anion gap: 10 (ref 5–15)
BUN: 11 mg/dL (ref 6–20)
CO2: 27 mmol/L (ref 22–32)
Calcium: 9 mg/dL (ref 8.9–10.3)
Chloride: 107 mmol/L (ref 98–111)
Creatinine: 0.79 mg/dL (ref 0.44–1.00)
GFR, Estimated: >60 mL/min (ref >60)
Glucose, Bld: 76 mg/dL (ref 70–99)
Potassium: 3.9 mmol/L (ref 3.5–5.1)
Sodium: 144 mmol/L (ref 135–145)
Total Bilirubin: 1 mg/dL (ref 0.0–1.2)
Total Protein: 7 g/dL (ref 6.5–8.1)

## 2024-03-21 LAB — IRON AND IRON BINDING CAPACITY (CC-WL,HP ONLY)
Iron: 106 ug/dL (ref 28–170)
Saturation Ratios: 25 % (ref 10.4–31.8)
TIBC: 419 ug/dL (ref 250–450)
UIBC: 313 ug/dL (ref 148–442)

## 2024-03-21 LAB — FERRITIN: Ferritin: 31 ng/mL (ref 11–307)

## 2024-03-21 NOTE — Progress Notes (Signed)
 Hematology and Oncology Follow Up Visit  Kelly Dean [id number] 15-Aug-1996 28 y.o. 03/21/2024   Principle Diagnosis:  Hemochromatosis, heterozygous for the H63D mutation   Current Therapy:   Donates with the [employer] regularly   Interim History:  Ms. Common is here today for follow-up. She is doing well and has no complaints at this time.  She had pulled a muscle in her right side last month working out and thankfully this has since resolved. No pain at this time. No liver or spleen tip palpated on exam.  She [facility] November 2024.  Cycle is unchanged from baseline. No other blood loss, abnormal bruising or petechiae.  No fever, chills, n/v, cough, rash, dizziness, SOB, chest pain, palpitations, abdominal pain or changes in bowel or bladder habits.  No swelling, numbness or tingling in her extremities.  She notes stillness in the ring and middle fingers of her left hand when lifting weights that comes and goes.  No falls or syncope.  Appetite and hydration are good. Weight is stable at 105 lbs.   ECOG Performance Status: 0 - Asymptomatic  Medications:  Allergies as of 03/21/2024       Reactions   Penicillins Rash, Hives   Lactose         Medication List        Accurate as of March 21, 2024  8:44 AM. If you have any questions, ask your nurse or doctor.          citalopram 20 MG tablet Commonly known as: CELEXA Take 1 tablet (20 mg total) by mouth daily.   FIBER PO Take 2 tablets by mouth daily.   hydrOXYzine 10 MG tablet Commonly known as: ATARAX TAKE 1 TABLET (10 MG TOTAL) BY MOUTH IN THE MORNING AND AT BEDTIME   levonorgestrel-ethinyl estradiol 0.15-30 MG-MCG tablet Commonly known as: NORDETTE Take 1 tablet by mouth daily.   multivitamin with minerals tablet Take 1 tablet by mouth daily.   valACYclovir 500 MG tablet Commonly known as: VALTREX Take 1 tablet (500 mg total) by mouth 2 (two) times daily as directed         Allergies:  Allergies  Allergen Reactions   Penicillins Rash and Hives   Lactose     Past Medical History, Surgical history, Social history, and Family History were reviewed and updated.  Review of Systems: All other 10 point review of systems is negative.   Physical Exam:  height is 5' (1.524 m) and weight is 105 lb (47.6 kg). Her oral temperature is 97.8 F (36.6 C). Her blood pressure is 112/63 and her pulse is 88. Her respiration is 17 and oxygen saturation is 100%.   Wt Readings from Last 3 Encounters:  03/21/24 105 lb (47.6 kg)  12/21/23 111 lb 1.9 oz (50.4 kg)  10/27/23 114 lb 9.6 oz (52 kg)    Ocular: Sclerae unicteric, pupils equal, round and reactive to light Ear-nose-throat: Oropharynx clear, dentition fair Lymphatic: No cervical or supraclavicular adenopathy Lungs no rales or rhonchi, good excursion bilaterally Heart regular rate and rhythm, no murmur appreciated Abd soft, nontender, positive bowel sounds MSK no focal spinal tenderness, no joint edema Neuro: non-focal, well-oriented, appropriate affect Breasts: Deferred   Lab Results  Component Value Date   WBC 6.0 03/21/2024   HGB 13.3 03/21/2024   HCT 39.1 03/21/2024   MCV 91.6 03/21/2024   PLT 263 03/21/2024   Lab Results  Component Value Date   FERRITIN 13  12/21/2023   IRON 164 12/21/2023   TIBC 414 12/21/2023   UIBC 250 12/21/2023   IRONPCTSAT 40 (H) 12/21/2023   Lab Results  Component Value Date   RETICCTPCT 1.2 12/21/2023   RBC 4.27 03/21/2024   No results found for: "KPAFRELGTCHN", "LAMBDASER", "KAPLAMBRATIO" No results found for: "IGGSERUM", "IGA", "IGMSERUM" No results found for: "TOTALPROTELP", "ALBUMINELP", "A1GS", "A2GS", "BETS", "BETA2SER", "GAMS", "MSPIKE", "SPEI"   Chemistry      Component Value Date/Time   NA 141 12/21/2023 0858   NA 141 10/27/2023 0901   K 4.1 12/21/2023 0858   CL 107 12/21/2023 0858   CO2 27 12/21/2023 0858   BUN 15 12/21/2023 0858   BUN 12  10/27/2023 0901   CREATININE 0.93 12/21/2023 0858   CREATININE 0.67 02/04/2016 0001      Component Value Date/Time   CALCIUM 8.8 (L) 12/21/2023 0858   ALKPHOS 36 (L) 12/21/2023 0858   AST 33 12/21/2023 0858   ALT 17 12/21/2023 0858   BILITOT 0.9 12/21/2023 0858       Impression and Plan: [name] is a very pleasant 28 yo caucasian female with hemochromatosis, heterozygous for the H63D mutation.  Iron studies pending. We will have her go donate [facility] if needed.  Follow-up in 6 months.   Eileen Stanford, NP 4/7/20258:44 AM

## 2024-03-22 ENCOUNTER — Encounter: Payer: Self-pay | Admitting: *Deleted

## 2024-04-14 ENCOUNTER — Other Ambulatory Visit: Payer: Self-pay | Admitting: Medical

## 2024-04-22 DIAGNOSIS — Z3041 Encounter for surveillance of contraceptive pills: Secondary | ICD-10-CM | POA: Diagnosis not present

## 2024-04-22 DIAGNOSIS — Z01419 Encounter for gynecological examination (general) (routine) without abnormal findings: Secondary | ICD-10-CM | POA: Diagnosis not present

## 2024-04-22 DIAGNOSIS — Z6821 Body mass index (BMI) 21.0-21.9, adult: Secondary | ICD-10-CM | POA: Diagnosis not present

## 2024-05-26 DIAGNOSIS — I83891 Varicose veins of right lower extremities with other complications: Secondary | ICD-10-CM | POA: Diagnosis not present

## 2024-06-07 ENCOUNTER — Other Ambulatory Visit: Payer: Self-pay | Admitting: Medical

## 2024-06-09 DIAGNOSIS — D229 Melanocytic nevi, unspecified: Secondary | ICD-10-CM | POA: Diagnosis not present

## 2024-06-09 DIAGNOSIS — D485 Neoplasm of uncertain behavior of skin: Secondary | ICD-10-CM | POA: Diagnosis not present

## 2024-06-16 DIAGNOSIS — F411 Generalized anxiety disorder: Secondary | ICD-10-CM | POA: Diagnosis not present

## 2024-06-16 DIAGNOSIS — F320 Major depressive disorder, single episode, mild: Secondary | ICD-10-CM | POA: Diagnosis not present

## 2024-06-16 DIAGNOSIS — F902 Attention-deficit hyperactivity disorder, combined type: Secondary | ICD-10-CM | POA: Diagnosis not present

## 2024-07-09 ENCOUNTER — Other Ambulatory Visit: Payer: Self-pay | Admitting: Medical

## 2024-07-11 NOTE — Telephone Encounter (Signed)
 Has an appt in mid November

## 2024-07-20 DIAGNOSIS — F902 Attention-deficit hyperactivity disorder, combined type: Secondary | ICD-10-CM | POA: Diagnosis not present

## 2024-07-20 DIAGNOSIS — F411 Generalized anxiety disorder: Secondary | ICD-10-CM | POA: Diagnosis not present

## 2024-07-20 DIAGNOSIS — F320 Major depressive disorder, single episode, mild: Secondary | ICD-10-CM | POA: Diagnosis not present

## 2024-08-16 DIAGNOSIS — L859 Epidermal thickening, unspecified: Secondary | ICD-10-CM | POA: Diagnosis not present

## 2024-08-16 DIAGNOSIS — D485 Neoplasm of uncertain behavior of skin: Secondary | ICD-10-CM | POA: Diagnosis not present

## 2024-09-06 ENCOUNTER — Other Ambulatory Visit: Payer: Self-pay | Admitting: Medical

## 2024-09-06 NOTE — Telephone Encounter (Signed)
Patient has an appt in November  

## 2024-09-19 ENCOUNTER — Inpatient Hospital Stay

## 2024-09-19 ENCOUNTER — Inpatient Hospital Stay: Admitting: Family

## 2024-09-21 ENCOUNTER — Inpatient Hospital Stay: Attending: Hematology & Oncology

## 2024-09-21 ENCOUNTER — Encounter: Payer: Self-pay | Admitting: Medical Oncology

## 2024-09-21 ENCOUNTER — Inpatient Hospital Stay (HOSPITAL_BASED_OUTPATIENT_CLINIC_OR_DEPARTMENT_OTHER): Admitting: Medical Oncology

## 2024-09-21 LAB — CBC WITH DIFFERENTIAL (CANCER CENTER ONLY)
Abs Immature Granulocytes: 0.03 K/uL (ref 0.00–0.07)
Basophils Absolute: 0 K/uL (ref 0.0–0.1)
Basophils Relative: 1 %
Eosinophils Absolute: 0.2 K/uL (ref 0.0–0.5)
Eosinophils Relative: 2 %
HCT: 40.6 % (ref 36.0–46.0)
Hemoglobin: 13.5 g/dL (ref 12.0–15.0)
Immature Granulocytes: 0 %
Lymphocytes Relative: 30 %
Lymphs Abs: 2.1 K/uL (ref 0.7–4.0)
MCH: 32.1 pg (ref 26.0–34.0)
MCHC: 33.3 g/dL (ref 30.0–36.0)
MCV: 96.7 fL (ref 80.0–100.0)
Monocytes Absolute: 0.4 K/uL (ref 0.1–1.0)
Monocytes Relative: 6 %
Neutro Abs: 4.2 K/uL (ref 1.7–7.7)
Neutrophils Relative %: 61 %
Platelet Count: 327 K/uL (ref 150–400)
RBC: 4.2 MIL/uL (ref 3.87–5.11)
RDW: 12.1 % (ref 11.5–15.5)
WBC Count: 6.9 K/uL (ref 4.0–10.5)
nRBC: 0 % (ref 0.0–0.2)

## 2024-09-21 LAB — CMP (CANCER CENTER ONLY)
ALT: 15 U/L (ref 0–44)
AST: 23 U/L (ref 15–41)
Albumin: 4.4 g/dL (ref 3.5–5.0)
Alkaline Phosphatase: 44 U/L (ref 38–126)
Anion gap: 15 (ref 5–15)
BUN: 21 mg/dL — ABNORMAL HIGH (ref 6–20)
CO2: 23 mmol/L (ref 22–32)
Calcium: 9.1 mg/dL (ref 8.9–10.3)
Chloride: 104 mmol/L (ref 98–111)
Creatinine: 0.88 mg/dL (ref 0.44–1.00)
GFR, Estimated: >60 mL/min (ref >60)
Glucose, Bld: 97 mg/dL (ref 70–99)
Potassium: 4.2 mmol/L (ref 3.5–5.1)
Sodium: 142 mmol/L (ref 135–145)
Total Bilirubin: 0.5 mg/dL (ref 0.0–1.2)
Total Protein: 6.9 g/dL (ref 6.5–8.1)

## 2024-09-21 LAB — FERRITIN: Ferritin: 27 ng/mL (ref 11–307)

## 2024-09-21 LAB — IRON AND IRON BINDING CAPACITY (CC-WL,HP ONLY)
Iron: 158 ug/dL (ref 28–170)
Saturation Ratios: 35 % — ABNORMAL HIGH (ref 10.4–31.8)
TIBC: 456 ug/dL — ABNORMAL HIGH (ref 250–450)
UIBC: 298 ug/dL

## 2024-09-21 NOTE — Progress Notes (Signed)
 Hematology and Oncology Follow Up Visit  Kelly Dean [id number] 10-Oct-1996 28 y.o. 09/21/2024   Principle Diagnosis:  Hemochromatosis, heterozygous for the H63D mutation   Current Therapy:   Donates with the [employer] regularly   Interim History:  Kelly Dean is here today for follow-up.   She states that she has been doing well.  Last eye exam - many years ago She has never had an abdominal US  Last TSH was 0.880 on 10/22/2022 She last donated [facility] in Nov 2024 Cycle is unchanged from baseline. No other blood loss, abnormal bruising or petechiae.  No fever, chills, n/v, cough, rash, dizziness, SOB, chest pain, palpitations, abdominal pain or changes in bowel or bladder habits.  No swelling, numbness or tingling in her extremities.  No rash No falls or syncope.  Appetite and hydration are good.   Wt Readings from Last 3 Encounters:  09/21/24 107 lb (48.5 kg)  03/21/24 105 lb (47.6 kg)  12/21/23 111 lb 1.9 oz (50.4 kg)     ECOG Performance Status: 0 - Asymptomatic  Medications:  Allergies as of 09/21/2024       Reactions   Lactose Diarrhea, Nausea And Vomiting, Other (See Comments)   Other: Bloating and abdominal pain    Penicillins Rash, Hives        Medication List        Accurate as of September 21, 2024  2:04 PM. If you have any questions, ask your nurse or doctor.          atomoxetine 40 MG capsule Commonly known as: STRATTERA Take 40 mg by mouth daily.   citalopram  20 MG tablet Commonly known as: CELEXA  TAKE 1 TABLET BY MOUTH EVERY DAY (MUST USE CONE OUTPT)   FIBER PO Take 2 tablets by mouth daily.   hydrOXYzine  10 MG tablet Commonly known as: ATARAX  TAKE 1 TABLET (10 MG TOTAL) BY MOUTH IN THE MORNING AND AT BEDTIME   levonorgestrel-ethinyl estradiol 0.15-30 MG-MCG tablet Commonly known as: NORDETTE Take 1 tablet by mouth daily.   multivitamin with minerals tablet Take 1 tablet by mouth daily.   valACYclovir  500 MG  tablet Commonly known as: VALTREX  Take 1 tablet (500 mg total) by mouth 2 (two) times daily as directed        Allergies:  Allergies  Allergen Reactions   Lactose Diarrhea, Nausea And Vomiting and Other (See Comments)    Other: Bloating and abdominal pain    Penicillins Rash and Hives    Past Medical History, Surgical history, Social history, and Family History were reviewed and updated.  Review of Systems: All other 10 point review of systems is negative.   Physical Exam:  height is 5' (1.524 m) and weight is 107 lb (48.5 kg). Her oral temperature is 98.6 F (37 C). Her blood pressure is 109/71 and her pulse is 92. Her respiration is 18 and oxygen saturation is 100%.   Wt Readings from Last 3 Encounters:  09/21/24 107 lb (48.5 kg)  03/21/24 105 lb (47.6 kg)  12/21/23 111 lb 1.9 oz (50.4 kg)    Ocular: Sclerae unicteric, pupils equal, round and reactive to light Ear-nose-throat: Oropharynx clear, dentition fair Lymphatic: No cervical or supraclavicular adenopathy Lungs no rales or rhonchi, good excursion bilaterally Heart regular rate and rhythm, no murmur appreciated Abd soft, nontender, positive bowel sounds MSK no focal spinal tenderness, no joint edema Neuro: non-focal, well-oriented, appropriate affect   Lab Results  Component Value Date   WBC  6.9 09/21/2024   HGB 13.5 09/21/2024   HCT 40.6 09/21/2024   MCV 96.7 09/21/2024   PLT 327 09/21/2024   Lab Results  Component Value Date   FERRITIN 31 03/21/2024   IRON 106 03/21/2024   TIBC 419 03/21/2024   UIBC 313 03/21/2024   IRONPCTSAT 25 03/21/2024   Lab Results  Component Value Date   RETICCTPCT 1.2 12/21/2023   RBC 4.20 09/21/2024   No results found for: KPAFRELGTCHN, LAMBDASER, KAPLAMBRATIO No results found for: KIMBERLY LE, IGMSERUM No results found for: STEPHANY CARLOTA BENSON MARKEL EARLA JOANNIE DOC VICK, SPEI   Chemistry      Component Value  Date/Time   NA 144 03/21/2024 0814   NA 141 10/27/2023 0901   K 3.9 03/21/2024 0814   CL 107 03/21/2024 0814   CO2 27 03/21/2024 0814   BUN 11 03/21/2024 0814   BUN 12 10/27/2023 0901   CREATININE 0.79 03/21/2024 0814   CREATININE 0.67 02/04/2016 0001      Component Value Date/Time   CALCIUM 9.0 03/21/2024 0814   ALKPHOS 49 03/21/2024 0814   AST 40 03/21/2024 0814   ALT 17 03/21/2024 0814   BILITOT 1.0 03/21/2024 0814     Encounter Diagnosis  Name Primary?   Hereditary hemochromatosis Yes   Impression and Plan: Kelly Dean is a very pleasant 28 yo caucasian female with hemochromatosis, heterozygous for the H63D mutation.   Hgb shows a value of 13.5 Iron studies pending. We will have her go donate [facility] if needed. CMP pending TSH order placed for next visit Abdominal US  order placed and discussed.   She will schedule an eye exam  RTC 6 months APP, labs   Lauraine CHRISTELLA Dais, [place] 10/8/20252:04 PM

## 2024-09-22 ENCOUNTER — Ambulatory Visit (HOSPITAL_BASED_OUTPATIENT_CLINIC_OR_DEPARTMENT_OTHER)
Admission: RE | Admit: 2024-09-22 | Discharge: 2024-09-22 | Disposition: A | Discharge status: Home / Self Care | Source: Ambulatory Visit | Attending: Medical Oncology | Admitting: Medical Oncology

## 2024-09-22 DIAGNOSIS — R160 Hepatomegaly, not elsewhere classified: Secondary | ICD-10-CM | POA: Diagnosis not present

## 2024-09-26 ENCOUNTER — Ambulatory Visit: Payer: Self-pay | Admitting: Medical Oncology

## 2024-09-27 ENCOUNTER — Ambulatory Visit: Payer: Self-pay | Admitting: Medical Oncology

## 2024-09-27 ENCOUNTER — Telehealth: Payer: Self-pay | Admitting: Medical Oncology

## 2024-09-27 NOTE — Telephone Encounter (Signed)
 Discussed her abdominal US  results. Likely a hemangioma which is a benign pooling of small blood vessels. Not likely to cause any issues. Radiology did recommend repeat US  or Hepatic MRI in 6-12 months.

## 2024-09-28 ENCOUNTER — Other Ambulatory Visit (HOSPITAL_BASED_OUTPATIENT_CLINIC_OR_DEPARTMENT_OTHER): Payer: Self-pay

## 2024-09-28 DIAGNOSIS — F902 Attention-deficit hyperactivity disorder, combined type: Secondary | ICD-10-CM | POA: Diagnosis not present

## 2024-09-28 DIAGNOSIS — F411 Generalized anxiety disorder: Secondary | ICD-10-CM | POA: Diagnosis not present

## 2024-09-28 DIAGNOSIS — F320 Major depressive disorder, single episode, mild: Secondary | ICD-10-CM | POA: Diagnosis not present

## 2024-09-28 DIAGNOSIS — G4700 Insomnia, unspecified: Secondary | ICD-10-CM | POA: Diagnosis not present

## 2024-09-28 MED ORDER — CITALOPRAM HYDROBROMIDE 20 MG PO TABS
20.0000 mg | ORAL_TABLET | Freq: Every day | ORAL | 0 refills | Status: DC
  Filled 2024-09-28: qty 90, 90d supply, fill #0

## 2024-09-28 MED ORDER — TRAZODONE HCL 50 MG PO TABS
50.0000 mg | ORAL_TABLET | Freq: Every day | ORAL | 1 refills | Status: DC
  Filled 2024-09-28: qty 30, 30d supply, fill #0
  Filled 2024-11-07: qty 30, 30d supply, fill #1

## 2024-09-28 MED ORDER — ATOMOXETINE HCL 80 MG PO CAPS
80.0000 mg | ORAL_CAPSULE | Freq: Every day | ORAL | 0 refills | Status: DC
  Filled 2024-09-28: qty 90, 90d supply, fill #0

## 2024-10-26 ENCOUNTER — Telehealth: Payer: Self-pay | Admitting: Internal Medicine

## 2024-10-26 NOTE — Telephone Encounter (Signed)
 Wanted to make sure this was done and someone would call to reschedule her appt.  Copied from CRM #[id number]. Topic: Appointments - Appointment Scheduling >> Oct 26, 2024 12:46 PM Travis F wrote: Patient is calling in because she needs to reschedule her physical. Attempted to schedule and the first available was 02/26. Patient says she needs a physical before then. Patient wants to know if there is any way she can be rescheduled before the new year for her physical. Please follow up with patient.

## 2024-10-28 DIAGNOSIS — R0602 Shortness of breath: Secondary | ICD-10-CM | POA: Diagnosis not present

## 2024-10-28 DIAGNOSIS — R1013 Epigastric pain: Secondary | ICD-10-CM | POA: Diagnosis not present

## 2024-10-28 DIAGNOSIS — R079 Chest pain, unspecified: Secondary | ICD-10-CM | POA: Diagnosis not present

## 2024-10-28 DIAGNOSIS — R000 Tachycardia, unspecified: Secondary | ICD-10-CM | POA: Diagnosis not present

## 2024-10-31 NOTE — Telephone Encounter (Signed)
 Patient calling back checking on the status of the below message. Patient unable to keep her 11/11/2024 physical appointment due to a work conflict. Offered patient next available physical  with PCP which would be 02/07/2024 at 1:45 pm. Patient requesting a work in physical with PCP or another physician prior to the end of the year. Patient has been placed on the wait list but would like a follow up call today regarding her request, please advise.

## 2024-10-31 NOTE — Telephone Encounter (Signed)
 Patient scheduled for 11/29/24.

## 2024-11-01 ENCOUNTER — Encounter: Payer: 59 | Admitting: Medical

## 2024-11-23 ENCOUNTER — Other Ambulatory Visit (HOSPITAL_BASED_OUTPATIENT_CLINIC_OR_DEPARTMENT_OTHER): Payer: Self-pay

## 2024-11-23 ENCOUNTER — Other Ambulatory Visit: Payer: Self-pay

## 2024-11-23 DIAGNOSIS — F411 Generalized anxiety disorder: Secondary | ICD-10-CM | POA: Diagnosis not present

## 2024-11-23 DIAGNOSIS — G4700 Insomnia, unspecified: Secondary | ICD-10-CM | POA: Diagnosis not present

## 2024-11-23 DIAGNOSIS — F320 Major depressive disorder, single episode, mild: Secondary | ICD-10-CM | POA: Diagnosis not present

## 2024-11-23 DIAGNOSIS — F902 Attention-deficit hyperactivity disorder, combined type: Secondary | ICD-10-CM | POA: Diagnosis not present

## 2024-11-23 MED ORDER — ATOMOXETINE HCL 40 MG PO CAPS
40.0000 mg | ORAL_CAPSULE | Freq: Every day | ORAL | 1 refills | Status: DC
  Filled 2024-11-23: qty 90, 90d supply, fill #0

## 2024-11-23 MED ORDER — TRAZODONE HCL 50 MG PO TABS
50.0000 mg | ORAL_TABLET | Freq: Every day | ORAL | 1 refills | Status: AC
  Filled 2024-12-11: qty 90, 90d supply, fill #0

## 2024-11-23 MED ORDER — AMPHETAMINE-DEXTROAMPHET ER 20 MG PO CP24
20.0000 mg | ORAL_CAPSULE | Freq: Every morning | ORAL | 0 refills | Status: DC
  Filled 2024-11-23: qty 30, 30d supply, fill #0

## 2024-11-23 MED ORDER — CITALOPRAM HYDROBROMIDE 20 MG PO TABS
20.0000 mg | ORAL_TABLET | Freq: Every day | ORAL | 0 refills | Status: AC
  Filled 2024-11-23: qty 90, 90d supply, fill #0

## 2024-11-29 ENCOUNTER — Encounter: Admitting: Medical

## 2024-12-01 ENCOUNTER — Encounter: Admitting: Medical

## 2024-12-11 ENCOUNTER — Other Ambulatory Visit (HOSPITAL_BASED_OUTPATIENT_CLINIC_OR_DEPARTMENT_OTHER): Payer: Self-pay

## 2024-12-22 ENCOUNTER — Other Ambulatory Visit (HOSPITAL_BASED_OUTPATIENT_CLINIC_OR_DEPARTMENT_OTHER): Payer: Self-pay

## 2024-12-22 ENCOUNTER — Other Ambulatory Visit (HOSPITAL_COMMUNITY): Payer: Self-pay

## 2024-12-24 ENCOUNTER — Other Ambulatory Visit (HOSPITAL_BASED_OUTPATIENT_CLINIC_OR_DEPARTMENT_OTHER): Payer: Self-pay

## 2024-12-26 ENCOUNTER — Other Ambulatory Visit (HOSPITAL_BASED_OUTPATIENT_CLINIC_OR_DEPARTMENT_OTHER): Payer: Self-pay

## 2024-12-26 MED ORDER — AMPHETAMINE-DEXTROAMPHET ER 15 MG PO CP24
15.0000 mg | ORAL_CAPSULE | Freq: Every morning | ORAL | 0 refills | Status: AC
  Filled 2024-12-26: qty 30, 30d supply, fill #0

## 2024-12-29 ENCOUNTER — Other Ambulatory Visit (HOSPITAL_BASED_OUTPATIENT_CLINIC_OR_DEPARTMENT_OTHER): Payer: Self-pay

## 2024-12-29 MED ORDER — CITALOPRAM HYDROBROMIDE 20 MG PO TABS
20.0000 mg | ORAL_TABLET | Freq: Every day | ORAL | 0 refills | Status: AC
  Filled 2024-12-29: qty 90, 90d supply, fill #0

## 2024-12-29 MED ORDER — AMPHETAMINE-DEXTROAMPHET ER 15 MG PO CP24: 15.0000 mg | ORAL_CAPSULE | Freq: Every morning | ORAL | 0 refills | Status: AC

## 2025-01-03 ENCOUNTER — Other Ambulatory Visit (HOSPITAL_BASED_OUTPATIENT_CLINIC_OR_DEPARTMENT_OTHER): Payer: Self-pay

## 2025-01-03 MED ORDER — CHLORHEXIDINE GLUCONATE 0.12 % MT SOLN
OROMUCOSAL | 0 refills | Status: AC
  Filled 2025-01-03: qty 473, 7d supply, fill #0

## 2025-01-03 MED ORDER — AZITHROMYCIN 250 MG PO TABS
ORAL_TABLET | ORAL | 0 refills | Status: AC
  Filled 2025-01-03: qty 6, 5d supply, fill #0

## 2025-01-06 ENCOUNTER — Encounter: Payer: Self-pay | Admitting: Medical

## 2025-01-06 ENCOUNTER — Ambulatory Visit: Admitting: Medical

## 2025-01-06 VITALS — BP 118/68 | HR 61 | Ht 60.25 in | Wt 108.6 lb

## 2025-01-06 DIAGNOSIS — Z131 Encounter for screening for diabetes mellitus: Secondary | ICD-10-CM | POA: Diagnosis not present

## 2025-01-06 DIAGNOSIS — G479 Sleep disorder, unspecified: Secondary | ICD-10-CM

## 2025-01-06 DIAGNOSIS — F419 Anxiety disorder, unspecified: Secondary | ICD-10-CM | POA: Diagnosis not present

## 2025-01-06 DIAGNOSIS — Z0000 Encounter for general adult medical examination without abnormal findings: Secondary | ICD-10-CM

## 2025-01-06 DIAGNOSIS — F909 Attention-deficit hyperactivity disorder, unspecified type: Secondary | ICD-10-CM

## 2025-01-06 DIAGNOSIS — Z13220 Encounter for screening for lipoid disorders: Secondary | ICD-10-CM | POA: Diagnosis not present

## 2025-01-06 NOTE — Progress Notes (Signed)
 "  Name: Kelly Dean   Date of Visit: 01/06/25   Date of last visit with me: 09/06/2024   CHIEF COMPLAINT:  Chief Complaint  Patient presents with   Annual Exam    Fasting cpe, no concerns       HPI:  Discussed the use of AI scribe software for clinical note transcription with the patient, who gave verbal consent to proceed.  History of Present Illness  Kelly Dean is a 29 year old female who presents for a wellness visit.  Patient Care Team: Zanyia Silbaugh, Alm RAMAN, PA-C as PCP - General (Family Medicine) Latisha Medford, MD as Consulting Physician (Obstetrics and Gynecology) Tonette Lauraine CHRISTELLA DEVONNA as Physician Assistant (Medical Oncology) Luke Louder with [facility]   She has a history of ADHD, managed with Adderall, and anxiety, for which she takes Celexa . She also uses trazodone  at night, Valtrex  as needed, a multivitamin, and birth control. She sometimes wakes up at night despite taking her medications.  She was diagnosed with hemochromatosis and follows up with her hematologist every three to six months. She notices increased iron saturation when her hand joints become stiff.  She has a history of severe heartburn, previously evaluated with normal liver, kidney, and electrolyte levels. No recent asthma issues and does not require inhaler refills.  Her father had elevated fasting glucose levels, prompting her interest in checking her A1c levels.   She is up to date with vaccinations, including tetanus and flu shots, and has had a recent Pap smear with gyn. She had a negative biopsy in past year from the bottom of her foot and sees [facility] for dermatological care.  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 She is in the process of purchasing a town home and lives with her cat. She exercises regularly, engaging in both weight-bearing and aerobic activities.  No other aggravating or  relieving factors. No other complaint.    Allergies[1]  Past Medical History:  Diagnosis Date   ADHD    Allergy    Asthma 05/16/2015   exercise induced, mild intermittent   Contraception management    [facility], started [facility] 2016   Eczema    Encounter for routine gynecological examination    sees Dr. Latisha, [facility]    Medications Ordered Prior to Encounter[2]   Current Medications[3]  Family History  Problem Relation Age of Onset   Migraines Mother    Hyperlipidemia Mother    Hypertension Father    Hyperlipidemia Father    Aneurysm Father        inguinal   Cancer Paternal Aunt        breast   Cancer Paternal Uncle        prostate   AAA (abdominal aortic aneurysm) Paternal Uncle    Heart disease Maternal Grandmother        surgery for PDA age 3, and aortic valve replacement at 37   Cancer Maternal Grandmother        liposarcoma   Hearing loss Maternal Grandmother    Arthritis Paternal Grandmother    Asthma Paternal Grandmother    Hyperlipidemia Paternal Grandmother    Hypertension Paternal Grandmother    Obesity Paternal Grandmother    Heart disease Paternal Grandfather 48   Hyperlipidemia Paternal Grandfather    Hypertension Paternal Grandfather    Stroke Neg Hx    Diabetes Neg Hx     Past Surgical History:  Procedure Laterality Date   GUM SURGERY     SCLEROTHERAPY  2019   vein therapy for varicosities/spider veins   WISDOM TOOTH EXTRACTION  2017     ROS as in subjective   Objective: BP 118/68   Pulse 61   Ht 5' 0.25 (1.53 m)   Wt 108 lb 9.6 oz (49.3 kg)   SpO2 100%   BMI 21.03 kg/m   General appearence: alert, no distress, WD/WN, petite white female HEENT: normocephalic, sclerae anicteric, PERRLA, EOMi, nares patent, no discharge or erythema, pharynx normal Oral cavity: MMM, no lesions, no bruits Neck: supple, no lymphadenopathy, no thyromegaly, no masses Heart: RRR, normal S1, S2, no murmurs Lungs: CTA  bilaterally, no wheezes, rhonchi, or rales Abdomen: +bs, soft, non tender, non distended, no masses, no hepatomegaly, no splenomegaly Back: non tender Musculoskeletal: nontender, no swelling, no obvious deformity Extremities: no edema, no cyanosis, no clubbing Pulses: 2+ symmetric, upper and lower extremities, normal cap refill Neurological: alert, oriented x 3, CN2-12 intact, strength normal upper extremities and lower extremities, sensation normal throughout, DTRs 2+ throughout, no cerebellar signs, gait normal Psychiatric: normal affect, behavior normal, pleasant  Breast/gyn - deferred to gyn    Assessment: Encounter Diagnoses  Name Primary?   Encounter for health maintenance examination in adult Yes   Hemochromatosis, unspecified hemochromatosis type    Sleep disturbance    Screening for lipid disorders    Anxiety    Attention deficit hyperactivity disorder (ADHD), unspecified ADHD type    Screening for diabetes mellitus      Plan: Health Maintenance -continue routine dental and eye follow up -continue gynecology and hematology  follow up -routine labs today -reviewed vaccines, cancer screening recommendations -continue weight bearing and aerobic exercise  Hemochromatosis Managed by hematology with regular follow-ups every 3 to 6 months. - Continue regular follow-ups with hematology. - Consider donating blood at least once a quarter to manage iron levels.  Screening for lipid disorders - Ordered lipid panel.  Screening for diabetes mellitus Screening planned due to family history of elevated fasting glucose. - Ordered A1c test.  ADHD, anxiety  -doing fine on current therapies  Kelly Dean was seen today for annual exam.  Diagnoses and all orders for this visit:  Encounter for health maintenance examination in adult -     Iron, TIBC and Ferritin Panel -     CBC with Differential/Platelet -     Comprehensive metabolic panel with GFR -     Lipid panel -      TSH -     Hemoglobin A1c  Hemochromatosis, unspecified hemochromatosis type -     Iron, TIBC and Ferritin Panel  Sleep disturbance  Screening for lipid disorders -     Lipid panel  Anxiety  Attention deficit hyperactivity disorder (ADHD), unspecified ADHD type  Screening for diabetes mellitus -     Hemoglobin A1c    F/u pending labs        [1]  Allergies Allergen Reactions   Lactose Diarrhea, Nausea And Vomiting and Other (See Comments)    Other: Bloating and abdominal pain    Penicillins Rash and Hives  [2]  Current Outpatient Medications on File Prior to Visit  Medication Sig Dispense Refill   amphetamine -dextroamphetamine  (ADDERALL XR) 15 MG 24 hr capsule Take 1 capsule by mouth in the morning. 30 capsule 0   azithromycin  (ZITHROMAX ) 250 MG tablet Take as directed on packaging. 6 tablet 0   chlorhexidine  (PERIDEX ) 0.12 % solution RINSE 1/2 OZ.  TWICE DAILY AFTER BREAKFAST AND BEFORE BEDTIME 473 mL 0   citalopram  (CELEXA ) 20 MG tablet TAKE 1 TABLET BY MOUTH EVERY DAY (MUST USE CONE OUTPT) 90 tablet 0   citalopram  (CELEXA ) 20 MG tablet Take 1 tablet (20 mg total) by mouth daily. 90 tablet 0   FIBER PO Take 2 tablets by mouth daily.     levonorgestrel-ethinyl estradiol (NORDETTE) 0.15-30 MG-MCG tablet Take 1 tablet by mouth daily.     Multiple Vitamins-Minerals (MULTIVITAMIN WITH MINERALS) tablet Take 1 tablet by mouth daily.     traZODone  (DESYREL ) 50 MG tablet Take 1 tablet (50 mg total) by mouth at bedtime. 90 tablet 1   valACYclovir  (VALTREX ) 500 MG tablet Take 1 tablet (500 mg total) by mouth 2 (two) times daily as directed 30 tablet 6   [START ON 01/24/2025] amphetamine -dextroamphetamine  (ADDERALL XR) 15 MG 24 hr capsule Take 1 capsule by mouth in the morning. 30 capsule 0   citalopram  (CELEXA ) 20 MG tablet Take 1 tablet (20 mg total) by mouth daily. 90 tablet 0   No current facility-administered medications on file prior to visit.  [3]  Current Outpatient  Medications:    amphetamine -dextroamphetamine  (ADDERALL XR) 15 MG 24 hr capsule, Take 1 capsule by mouth in the morning., Disp: 30 capsule, Rfl: 0   azithromycin  (ZITHROMAX ) 250 MG tablet, Take as directed on packaging., Disp: 6 tablet, Rfl: 0   chlorhexidine  (PERIDEX ) 0.12 % solution, RINSE 1/2 OZ. TWICE DAILY AFTER BREAKFAST AND BEFORE BEDTIME, Disp: 473 mL, Rfl: 0   citalopram  (CELEXA ) 20 MG tablet, TAKE 1 TABLET BY MOUTH EVERY DAY (MUST USE CONE OUTPT), Disp: 90 tablet, Rfl: 0   citalopram  (CELEXA ) 20 MG tablet, Take 1 tablet (20 mg total) by mouth daily., Disp: 90 tablet, Rfl: 0   FIBER PO, Take 2 tablets by mouth daily., Disp: , Rfl:    levonorgestrel-ethinyl estradiol (NORDETTE) 0.15-30 MG-MCG tablet, Take 1 tablet by mouth daily., Disp: , Rfl:    Multiple Vitamins-Minerals (MULTIVITAMIN WITH MINERALS) tablet, Take 1 tablet by mouth daily., Disp: , Rfl:    traZODone  (DESYREL ) 50 MG tablet, Take 1 tablet (50 mg total) by mouth at bedtime., Disp: 90 tablet, Rfl: 1   valACYclovir  (VALTREX ) 500 MG tablet, Take 1 tablet (500 mg total) by mouth 2 (two) times daily as directed, Disp: 30 tablet, Rfl: 6   [START ON 01/24/2025] amphetamine -dextroamphetamine  (ADDERALL XR) 15 MG 24 hr capsule, Take 1 capsule by mouth in the morning., Disp: 30 capsule, Rfl: 0   citalopram  (CELEXA ) 20 MG tablet, Take 1 tablet (20 mg total) by mouth daily., Disp: 90 tablet, Rfl: 0  "

## 2025-01-07 LAB — LIPID PANEL
Chol/HDL Ratio: 2.4 ratio (ref 0.0–4.4)
Cholesterol, Total: 156 mg/dL (ref 100–199)
HDL: 66 mg/dL
LDL Chol Calc (NIH): 77 mg/dL (ref 0–99)
Triglycerides: 65 mg/dL (ref 0–149)
VLDL Cholesterol Cal: 13 mg/dL (ref 5–40)

## 2025-01-07 LAB — CBC WITH DIFFERENTIAL/PLATELET
Basophils Absolute: 0.1 10*3/uL (ref 0.0–0.2)
Basos: 1 %
EOS (ABSOLUTE): 0.1 10*3/uL (ref 0.0–0.4)
Eos: 3 %
Hematocrit: 39.5 % (ref 34.0–46.6)
Hemoglobin: 12.7 g/dL (ref 11.1–15.9)
Immature Grans (Abs): 0 10*3/uL (ref 0.0–0.1)
Immature Granulocytes: 0 %
Lymphocytes Absolute: 1.8 10*3/uL (ref 0.7–3.1)
Lymphs: 32 %
MCH: 31.9 pg (ref 26.6–33.0)
MCHC: 32.2 g/dL (ref 31.5–35.7)
MCV: 99 fL — ABNORMAL HIGH (ref 79–97)
Monocytes Absolute: 0.3 10*3/uL (ref 0.1–0.9)
Monocytes: 5 %
Neutrophils Absolute: 3.3 10*3/uL (ref 1.4–7.0)
Neutrophils: 59 %
Platelets: 301 10*3/uL (ref 150–450)
RBC: 3.98 x10E6/uL (ref 3.77–5.28)
RDW: 11.7 % (ref 11.7–15.4)
WBC: 5.5 10*3/uL (ref 3.4–10.8)

## 2025-01-07 LAB — IRON,TIBC AND FERRITIN PANEL
Ferritin: 38 ng/mL (ref 15–150)
Iron Saturation: 46 % (ref 15–55)
Iron: 150 ug/dL (ref 27–159)
Total Iron Binding Capacity: 324 ug/dL (ref 250–450)
UIBC: 174 ug/dL (ref 131–425)

## 2025-01-07 LAB — COMPREHENSIVE METABOLIC PANEL WITH GFR
ALT: 9 [IU]/L (ref 0–32)
AST: 15 [IU]/L (ref 0–40)
Albumin: 4.2 g/dL (ref 4.0–5.0)
Alkaline Phosphatase: 39 [IU]/L — ABNORMAL LOW (ref 41–116)
BUN/Creatinine Ratio: 19 (ref 9–23)
BUN: 16 mg/dL (ref 6–20)
Bilirubin Total: 0.7 mg/dL (ref 0.0–1.2)
CO2: 21 mmol/L (ref 20–29)
Calcium: 9.1 mg/dL (ref 8.7–10.2)
Chloride: 104 mmol/L (ref 96–106)
Creatinine, Ser: 0.85 mg/dL (ref 0.57–1.00)
Globulin, Total: 1.9 g/dL (ref 1.5–4.5)
Glucose: 79 mg/dL (ref 70–99)
Potassium: 4.2 mmol/L (ref 3.5–5.2)
Sodium: 138 mmol/L (ref 134–144)
Total Protein: 6.1 g/dL (ref 6.0–8.5)
eGFR: 94 mL/min/{1.73_m2}

## 2025-01-07 LAB — HEMOGLOBIN A1C
Est. average glucose Bld gHb Est-mCnc: 94 mg/dL
Hgb A1c MFr Bld: 4.9 % (ref 4.8–5.6)

## 2025-01-07 LAB — TSH: TSH: 1.21 u[IU]/mL (ref 0.450–4.500)

## 2025-01-08 ENCOUNTER — Ambulatory Visit: Payer: Self-pay | Admitting: Medical

## 2025-01-08 NOTE — Progress Notes (Signed)
 Results thru my chart  See if we can add a copper level or have her return for this lab specifically (due to low alkaline phosphatase and hx/o hemochromatosis)

## 2025-01-10 ENCOUNTER — Other Ambulatory Visit: Payer: Self-pay | Admitting: Medical

## 2025-01-10 DIAGNOSIS — R748 Abnormal levels of other serum enzymes: Secondary | ICD-10-CM

## 2025-01-11 ENCOUNTER — Other Ambulatory Visit

## 2025-01-11 DIAGNOSIS — R748 Abnormal levels of other serum enzymes: Secondary | ICD-10-CM

## 2025-01-13 ENCOUNTER — Ambulatory Visit: Payer: Self-pay | Admitting: Medical

## 2025-01-13 LAB — COPPER, SERUM: Copper: 121 ug/dL (ref 73–140)

## 2025-01-13 NOTE — Progress Notes (Signed)
 Results through MyChart

## 2025-03-22 ENCOUNTER — Ambulatory Visit: Admitting: Medical Oncology

## 2025-03-22 ENCOUNTER — Inpatient Hospital Stay

## 2026-01-10 ENCOUNTER — Encounter: Admitting: Medical
